# Patient Record
Sex: Female | Born: 1995 | Hispanic: Yes | Marital: Single | Smoking: Never smoker
Health system: Southern US, Community
[De-identification: ages and names within clinical notes are randomized; demographics above are authoritative.]

## PROBLEM LIST (undated history)

## (undated) DIAGNOSIS — Z87798 Personal history of other (corrected) congenital malformations: Secondary | ICD-10-CM

## (undated) DIAGNOSIS — Z789 Other specified health status: Secondary | ICD-10-CM

## (undated) HISTORY — DX: Personal history of other (corrected) congenital malformations: Z87.798

---

## 2016-10-28 NOTE — L&D Delivery Note (Signed)
Patient is 21 y.o. G2P1001 101w6d admitted SROM. S/p IOL with foley bulb, followed by Pitocin.  Prenatal course also complicated by hx pCS in [place].  Delivery Note At 6:19 AM a viable female was delivered via VBAC, Spontaneous (Presentation: LOA, compound).  APGAR: 9, 9; weight  pending.   Placenta status: intact with 3 vessels.  Cord: none.  Cord pH: N/A  Anesthesia:  Epidural Episiotomy: None Lacerations: 2nd degree;Perineal Suture Repair: 2.0 vicryl rapide Est. Blood Loss (mL): 200  Mom to postpartum.  Baby to Couplet care / Skin to Skin.  Upon arrival patient was complete and pushing. She pushed with good maternal effort to deliver a viable female infant in cephalic, LOA position, compound presentation. No nuchal cord present. Baby delivered without difficulty (anterior shoulder delivered with ease), was noted to have good tone and place on maternal abdomen for oral suctioning, drying and stimulation. Delayed cord clamping performed. Placenta delivered spontaneously with gentle cord traction. Fundus firm with massage and Pitocin. Perineum inspected and found to have 2nd degree perineal laceration, which was repaired with 2-0 Vicryl Rapide with good hemostasis achieved. Counts of sharps, instruments, and lap pads were all correct.   Bedelia Person, CNM, supervised delivery and repair.  Burna Cash, MD Family Medicine Resident, PGY-3 07/15/2017 6:51 AM  Midwife attestation: I was gloved and present for delivery in its entirety and I agree with the above resident's note.  Donette Larry, CNM 7:09 AM

## 2017-01-13 LAB — OB RESULTS CONSOLE GC/CHLAMYDIA
CHLAMYDIA, DNA PROBE: NEGATIVE
GC PROBE AMP, GENITAL: NEGATIVE

## 2017-01-13 LAB — OB RESULTS CONSOLE ABO/RH: RH TYPE: POSITIVE

## 2017-01-13 LAB — OB RESULTS CONSOLE RUBELLA ANTIBODY, IGM: Rubella: IMMUNE

## 2017-01-13 LAB — OB RESULTS CONSOLE RPR: RPR: NONREACTIVE

## 2017-01-13 LAB — OB RESULTS CONSOLE ANTIBODY SCREEN: ANTIBODY SCREEN: NEGATIVE

## 2017-01-13 LAB — OB RESULTS CONSOLE HIV ANTIBODY (ROUTINE TESTING): HIV: NONREACTIVE

## 2017-01-13 LAB — OB RESULTS CONSOLE HEPATITIS B SURFACE ANTIGEN: HEP B S AG: NEGATIVE

## 2017-02-04 ENCOUNTER — Other Ambulatory Visit (HOSPITAL_COMMUNITY): Payer: Self-pay | Admitting: Nurse Practitioner

## 2017-02-04 DIAGNOSIS — Z368A Encounter for antenatal screening for other genetic defects: Secondary | ICD-10-CM

## 2017-02-21 ENCOUNTER — Ambulatory Visit (HOSPITAL_COMMUNITY)
Admission: RE | Admit: 2017-02-21 | Discharge: 2017-02-21 | Disposition: A | Discharge status: Home / Self Care | Payer: Self-pay | Source: Ambulatory Visit | Attending: Nurse Practitioner | Admitting: Nurse Practitioner

## 2017-02-21 ENCOUNTER — Encounter (HOSPITAL_COMMUNITY): Payer: Self-pay

## 2017-02-21 ENCOUNTER — Other Ambulatory Visit (HOSPITAL_COMMUNITY): Payer: Self-pay

## 2017-02-21 ENCOUNTER — Other Ambulatory Visit (HOSPITAL_COMMUNITY): Payer: Self-pay | Admitting: *Deleted

## 2017-02-21 DIAGNOSIS — O283 Abnormal ultrasonic finding on antenatal screening of mother: Secondary | ICD-10-CM

## 2017-02-21 DIAGNOSIS — Z8279 Family history of other congenital malformations, deformations and chromosomal abnormalities: Secondary | ICD-10-CM | POA: Insufficient documentation

## 2017-02-21 DIAGNOSIS — Z822 Family history of deafness and hearing loss: Secondary | ICD-10-CM

## 2017-02-21 DIAGNOSIS — Z363 Encounter for antenatal screening for malformations: Secondary | ICD-10-CM | POA: Insufficient documentation

## 2017-02-21 DIAGNOSIS — Z3A20 20 weeks gestation of pregnancy: Secondary | ICD-10-CM | POA: Insufficient documentation

## 2017-02-21 DIAGNOSIS — Z368A Encounter for antenatal screening for other genetic defects: Secondary | ICD-10-CM | POA: Insufficient documentation

## 2017-02-21 HISTORY — DX: Other specified health status: Z78.9

## 2017-02-25 ENCOUNTER — Encounter (HOSPITAL_COMMUNITY): Payer: Self-pay

## 2017-02-25 DIAGNOSIS — Z822 Family history of deafness and hearing loss: Secondary | ICD-10-CM | POA: Insufficient documentation

## 2017-02-25 NOTE — Progress Notes (Signed)
Genetic Counseling  High-Risk Gestation Note  Appointment Date:  02/21/2017 Referred By: Jamie Click, NP Date of Birth:  [birth year] Partner:  Jamie Hess   Pregnancy History: [id number] Estimated Date of Delivery: 07/09/17 Estimated Gestational Age: 28w2dAttending: EAbram Sander MD   I met with Jamie. Jamie Gollidayfor genetic counseling because of a family history of hearing loss/deafness. Telephonic Spanish/English medical interpreter (Jamie Hess) #[phone] interpretation for today's visit.   In summary:  Discussed family history of hearing loss/deafness for the couple's 513year Jamie daughter, unknown etiology  Reviewed prelingual hearing loss can be acquired/environmental (~20%) versus genetic (~80%)  Prelingual hearing loss can be syndromic versus nonsyndromic; [id number] prelingual hearing loss is genetic, nonsyndromic, and autosomal recessive  Reviewed various inheritance patterns seen for genetic causes of hearing loss including autosomal recessive, autosomal dominant, X-linked, mitochondrial  Recurrence risk for current pregnancy depends upon daughter's etiology (which is unknown) but would likely range from low to 25%  Discussed options of screening / testing  Expanded carrier screening panel (including some but not all genes associated with hearing loss)- patient accepted today  Discussed screening/testing for vast majority of genetic etiologies for hearing loss not available in the current pregnancy given that etiology is not determined for her daughter  Newborn screening hearing assessment  Discussed general population carrier screening options - accepted today, as part of expanded pan-ethnic carrier screening panel ([facility])  CF  SMA  Hemoglobinopathies  We began by reviewing the family history in detail. Jamie Hess that her daughter, AMarlene Hess has profound bilateral hearing loss of unknown  etiology. [place] that she did not have complications during her pregnancy with her daughter including no known exposure to infections. Additionally, the patient reported that her daughter did not have infections in the neonatal period, to the best of her knowledge. Jamie Hess has been evaluated at [facility] and the patient reported that she was possibly a candidate for cochlear implant. However, Jamie. ELaron Hess that they have not been able to pursue additional medical treatment or additional workup, given that Jamie Hess does not currently had medical insurance coverage. Jamie Hess that to the best of her knowledge, her daughter has not had genetic testing to assess for possible underlying genetic etiologies, which is likely in part due to the potential associated costs of additional testing. She reportedly walked at age 23 monthsbut was not described to additional developmental delays. She is currently 89years Jamie and is learning sign language through the school system. She was not described to have dysmorphic features and no additional medical concerns. The patient reported that Jamie. Jamie Hess current pregnancy is with the same partner. The patient reported that her father possibly had an uncle with hearing loss, but she had limited information regarding this history. There is no known consanguinity for Jamie Hess the father of the pregnancy.   We reviewed the hearing loss can be categorized as either prelingual (occurring prior to the development of speech) or postlingual (occurring after the development of normal speech). Prelingual hearing loss includes congenital hearing loss but does not necessarily have to be congenital. Additionally, hearing loss is typically categorized as either syndromic or nonsyndromic. The incidence of prelingual hearing loss is estimated to be 1 in 500. Prelingual hearing loss can  be hereditary or nonhereditary. It is estimated that for prelingual hearing loss in developed countries,  approximately 20% is acquired/environmental and approximately 80% is genetic.  Nonhereditary causes of prelingual hearing loss include teratogenic exposures in pregnancy, such as congenital infection or medications, or postnatal exposures, such as infections or medication use. Regarding genetic causes of prelingual hearing loss, approximately 20-30% is syndromic (including over 400 genetic syndromes), and approximately 70-80% is nonsyndromic. There are numerous forms of both syndromic and nonsyndromic hereditary hearing loss following various patterns of inheritance including autosomal recessive, autosomal dominant, X-linked, and mitochondrial inheritance. For nonsyndromic genetic hearing loss, the majority of cases follow autosomal recessive inheritance. The reported family history for Jamie Hess was not suggestive of syndromic forms of hearing loss. We thus, focused our conversation of discussing various explanations for nonsyndromic hearing loss.   We reviewed genes, chromosomes, and various patterns of inheritance including autosomal recessive, autosomal dominant, and X-linked inheritance. The majority of hereditary prelingual hearing loss is nonsyndromic and follows autosomal recessive inheritance. Approximately 50% of hereditary nonsyndromic hearing loss is attributed to DFNB1, which is caused by mutations in GJB2 (which encode protein connexin 26) and GJB6 (which encodes protein connexin 30). We discussed that in autosomal recessive inheritance, an individual typically has the particular disorder when both copies of a particular gene pair have a nonworking change. This is typically only inherited when both parents are at least carriers.  A carrier refers to an individual with one nonworking copy of the gene and one working copy of the gene pair. Each pregnancy of a carrier couple for a  particular autosomal recessive condition has a 1 in 4 (25%) chance to inherit the condition. All offspring of an individual with an autosomal recessive condition would be obligate carriers. The carrier frequency for the most common cause of autosomal recessive nonsyndromic hearing loss, GJB2, is approximately 1 in 61. Autosomal dominant inheritance describes one nonworking copy of a particular gene pair leading to a particular disorder, and recurrence risk for each offspring for an affected individual would be 1 in 2 (50%). X-linked inheritance typically describes females carrying a nonworking gene change on the X chromosome, typically mildly symptomatic to asymptomatic. Carrier females would have a 1 in 4 chance for each of the following with each pregnancy: a female who inherits the nonworking gene and is a carrier, a female who is neither a carrier nor affected, a female who is unaffected, and a female who inherits the nonworking gene and is affected. Males with an X-linked condition would be expected to have either daughters who are carriers or sons who are unaffected. Less commonly, mitochondrial genetic changes, which would be maternally inherited, can cause nonsyndromic hearing loss.  Jamie Hess understands that without knowing the specific etiology for hearing loss for her daughter, exact recurrence risk for hearing loss in the current pregnancy cannot be determined and subsequently prenatal screening or diagnosis for hearing loss in the current pregnancy is not likely to be available or informative. However, we discussed a range of recurrence risk estimates based on the available information.  In the case of an environmental cause, such as either in utero or postnatal exposure or infection, recurrence risk for hearing loss for their offspring would not be expected to be increased above the general population risk. In the case of autosomal recessive inheritance, the pregnancy would have a 1 in 4  (25%) chance for hearing loss/deafness. In the case of autosomal dominant, recurrence risk would likely be low, given that neither parent is reportedly symptomatic. An X-linked pattern of inheritance, is less likely, given  that the affected individual is female, but cannot be ruled out given that female carriers can be symptomatic in some X-linked conditions. In summary, recurrence risk may range from none (in the case of environmental causes for both the patient and her partner) to 25%.    We discussed that molecular testing is clinically available for genetic forms of hearing loss. A medical genetics evaluation with genetic testing, if warranted based on evaluation for the patient's daughter would be most informative to assess for underlying etiologies and better determine recurrence risk for relatives. The patient's daughter's pediatrician can facilitate a medical genetics evaluation, if desired for the patient's daughter.  In the case that an underlying genetic cause has been identified, prenatal diagnosis may be available via amniocentesis. We discussed the risks, benefits, and limitations of amniocentesis. However, we also discussed that the turnaround time for molecular testing can range, depending upon the number of genes that are tested and the methodology used. We discussed that prenatal diagnosis would not likely be an option in the current pregnancy, given her current gestational age and given that molecular testing would first need to be performed for daughter. The patient indicated that she would not likely be interested in molecular testing for her daughter at this time, given potential cost of testing.   We discussed the option of carrier screening for Jamie Hess via expanded pan-ethnic carrier screening. We reviewed that ACOG currently recommends that all patients be offered carrier screening for cystic fibrosis, spinal muscular atrophy and hemoglobinopathies, which are conditions  unrelated to the patient's reported family history. In addition, she was counseled that there are a variety of genetic screening laboratories that have pan-ethnic, or expanded, carrier screening panels, which evaluate carrier status for a wide range of genetic conditions. Some of the genes included on these panels relate to genetic forms of hearing loss. However, we discussed that this panel does not assess for all genetic forms of hearing loss and also includes carrier screening for conditions that are unrelated to hearing loss. Thus, this carrier screen would not necessarily and not likely diagnose the underlying cause for hearing loss in her daughter and also may identify carrier status for a separate condition(s). Some of these conditions included on the panel are severe and actionable, but also rare; others occur more commonly, but are less severe. We reviewed that the prevalence of each condition varies (and often varies with ethnicity). Thus the couples' background risk to be a carrier for each of these various conditions would range, and in some cases be very low or unknown. Similarly, the detection rate varies with each condition and also varies in some cases with ethnicity, ranging from greater than 99% (in the case of hemoglobinopathies) to unknown. We reviewed that a negative carrier screen would thus reduce, but not eliminate the chance to be a carrier for these conditions. For some conditions included on specific pan-ethnic carrier screening panels, the pre-test carrier frequency and/or the detection rate is unknown. After careful consideration, Jamie. Lavoris Canizales elected to proceed with expanded pan-ethnic carrier screening panel through [facility] laboratory, which includes 175 autosomal recessive and X-linked conditions.    We reviewed that hearing is assessed as part of newborn screening for babies born in the hospital in [place] and that it would be important for her child's pediatrician to  be aware of this history.    The family histories were otherwise found to be contributory for autism for a niece and nephew of the father of  the pregnancy (each affected relative is a child of a different sister to the father of the pregnancy). Limited information was known regarding whether or not an underlying etiology was determined. The children of the additional 7 siblings to the father of the pregnancy are reportedly healthy. We discussed that autism is part of the spectrum of conditions referred to as Autistic spectrum disorders (ASD). We discussed that ASDs are among the most common neurodevelopmental disorders, with approximately 1 in 68 children meeting criteria for ASD, according to the Centers for Disease Control. Approximately 80% of individuals diagnosed are female. There is strong evidence that genetic factors play a critical role in development of ASD. There have been recent advances in identifying specific genetic causes of ASD, however, there are still many individuals for whom the etiology of the ASD is not known. The majority of individuals with ASD (70-80%) have essential autism. There is strong evidence that genetic factors play a critical role in development of ASD. Some individuals with ASDs are found to have causative differences in karyotype analysis, chromosomal microarray analysis, or single genes. These are more likely to be identified in individuals with complex autism spectrum disorders.  Once a family has a child with a diagnosis of ASD, there is a 13.5% chance to have another child with ASD. If the pregnancy is female the chance is approximately 9%, and approximately 26% if the pregnancy is female. Limited information is available regarding recurrence risk estimated for extended degree relatives. They understand that at this time there is not prenatal genetic screening or testing available for ASD for most families.  Without further information regarding the provided family history, an  accurate genetic risk cannot be calculated. Further genetic counseling is warranted if more information is obtained.   Jamie. Shimeka Bacot previously had [facility] screening, which was within normal range for the conditions screened. This screening does not diagnose or rule out these conditions but rather provides a pregnancy specific risk assessment. Detailed ultrasound was performed today. Complete ultrasound results reported under separate cover.   Jamie Calandria Mullings denied exposure to environmental toxins or chemical agents. She denied the use of alcohol, tobacco or street drugs. She denied significant viral illnesses during the course of her pregnancy. Her medical and surgical histories were noncontributory.   I counseled Jamie. Arnell Sieving regarding the above risks and available options.  The approximate face-to-face time with the genetic counselor was 50 minutes.  Chipper Oman, Jamie Certified Genetic Counselor 02/25/2017

## 2017-02-28 ENCOUNTER — Other Ambulatory Visit: Payer: Self-pay

## 2017-03-07 ENCOUNTER — Telehealth (HOSPITAL_COMMUNITY): Payer: Self-pay | Admitting: MS"

## 2017-03-07 NOTE — Telephone Encounter (Signed)
Called Ms. Moshe CiproMaria Espinoza Parra via [facility] telephonic interpreter, OdellPablo, #[phone] to discuss her carrier screening results. Mrs. Moshe CiproMaria Espinoza Parra had expanded carrier screening through [facility] (175 conditions). The patient was identified by name and DOB. We reviewed that the results are negative for all of the conditions for which analysis was performed, with the exception of PCDH15-related disorders and Nephrotic syndrome, NPHS2-related.  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 We discussed both conditions briefly for which she screened positive.   Most of the conversation focused on the PCDH15-related disorders result, given the patient's family history of a previous daughter with congenital deafness. PCDH15-related disorders are associated with hearing loss with/without vision loss. Specifically, Usher syndrome type 32F is caused by pathogenic variants in [facility] L[facility] Memorial HospitalCDH15, which is associated with profound bilateral congenital deafness, balanced problems, and retinitis pigmentosa. We discussed that while this does not directly diagnose the underlying cause of the congenital hearing loss for the couple's previous child, it increases the suspicion for this condition as a possible underlying differential diagnosis. The patient understands that medical evaluation for her daughter and genetic testing for her would be needed to establish the underlying cause of deafness for her daughter.   We reviewed that both conditions for which Ms. Loretha Staplerspinoza Parra screened positive follow autosomal recessive inheritance, and thus the risk to the current pregnancy is partly determined by carrier status of the father of the pregnancy. When both parents are carriers for the same autosomal recessive condition, there is a 1 in 4 (25%) chance for each pregnancy to  be affected.   We discussed the option of carrier screening for her partner, Burley Saveredro for both PCDH15-related disorders (including Usher syndrome 32F) and for Nephrotic syndrome, NPHS2-related, given that this would more accurately determine risk assessment for each condition in the current pregnancy. The patient inquired about cost of testing, given that he currently does not have medical insurance. We discussed that [facility] has financial assistance and given the eligibility criteria listed on the lab website, he would likely qualify for no charge testing.  The patient was offered and accepted follow-up genetic counseling, which was scheduled at the time of her return ultrasound on 5/25 to discuss her carrier screening results in more detail. She stated that she will plan for her partner to accompany her to that appointment and for him to pursue carrier screening at that time.   Carrier screening does not detect all carriers of all of these conditions, but a normal result significantly decreases the likelihood of being a carrier, and therefore, the overall reproductive risk. [facility] sequences most of the genes, which is associated with a high detection rate for carriers, thus a negative screen is very reassuring. All questions were answered to her satisfaction, she was encouraged to call with additional questions or concerns. ? Quinn PlowmanKaren Maryalyce Sanjuan, MS Patent attorneyCertified Genetic Counselor

## 2017-03-21 ENCOUNTER — Ambulatory Visit (HOSPITAL_COMMUNITY)
Admission: RE | Admit: 2017-03-21 | Discharge: 2017-03-21 | Disposition: A | Discharge status: Home / Self Care | Payer: Self-pay | Source: Ambulatory Visit | Attending: Nurse Practitioner | Admitting: Nurse Practitioner

## 2017-03-21 ENCOUNTER — Encounter (HOSPITAL_COMMUNITY): Payer: Self-pay

## 2017-03-21 ENCOUNTER — Other Ambulatory Visit (HOSPITAL_COMMUNITY): Payer: Self-pay | Admitting: Maternal & Fetal Medicine

## 2017-03-21 DIAGNOSIS — Z362 Encounter for other antenatal screening follow-up: Secondary | ICD-10-CM

## 2017-03-21 DIAGNOSIS — Z3A24 24 weeks gestation of pregnancy: Secondary | ICD-10-CM

## 2017-03-21 DIAGNOSIS — Z8279 Family history of other congenital malformations, deformations and chromosomal abnormalities: Secondary | ICD-10-CM

## 2017-03-21 DIAGNOSIS — Z148 Genetic carrier of other disease: Secondary | ICD-10-CM

## 2017-03-21 DIAGNOSIS — Z822 Family history of deafness and hearing loss: Secondary | ICD-10-CM

## 2017-03-21 DIAGNOSIS — O283 Abnormal ultrasonic finding on antenatal screening of mother: Secondary | ICD-10-CM

## 2017-03-21 NOTE — Progress Notes (Signed)
Genetic Counseling  High-Risk Gestation Note  Appointment Date:  03/21/2017 Referred By: Jolaine Click, NP Date of Birth:  01-Oct-1996 Partner:  Arbie Cookey   Pregnancy History: [id number] Estimated Date of Delivery: 07/09/17 Estimated Gestational Age: 18w2dAttending: MGriffin Dakin MD   I met with Ms. 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 [facility]English medical interpreter, EDanae Chen provided interpretation for today's visit.   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  Reviewed risks to offspring  Prior to testing partner  After negative test  After positive test  Discussed options of screening / testing  Carrier screening for partner for NPHS2 and PCDH15 only  Expanded carrier screening for partner  Patient's partner, PMeda Coffee plans to return to our office on 5/29 for lab draw, likely for expanded carrier screen through [facility]  Spent time particularly discussing Usher syndrome type 55F (PCDH15-related disorders) given the described medical features for couple's previous daughter  It may be helpful for couple's daughter to be referred by her PCP for medical genetics evaluation, regardless of Mr. MEdsel Petrin carrier screen results to ensure that she is being screened appropriately for potential medical issues   In the event that partner is also identified to have carrier status for either condition, patient declines prenatal diagnosis via amniocentesis  We began by reviewing the process of expanded carrier screening and autosomal recessive inheritance. We discussed that each person is estimated to have 7-10 genes that do not work correctly, meaning that each person is  estimated to be a carrier of 7-10 different genetic conditions. The majority of these conditions follow autosomal recessive inheritance. We reviewed that we each have two copies of all of our genes, one inherited from each parent. In a recessive condition, if one copy of the pair of genes is changed in a way that causes it not to function properly, but the other copy of the gene works, a person is considered to be a "carrier" for that condition. As carriers have a functioning gene, they typically do not have any health problems related to the non-working gene(s). If both parents are carriers for the same recessive condition, there is a 1 in 4 chance with each pregnancy to have a child who receives both non-working genes and is affected with that specific condition.   Ms MTorie TowleParra's carrier screen identified that she is a carrier of PCDH15-related disorders and Nephrotic syndrome, NPHS2-related. We discussed that each of these conditions follows an autosomal recessive pattern of inheritance and briefly reviewed genes and chromosomes. Thus, being a carrier would not be sufficient for symptom development and is not expected to impact the patient's personal medical management. However, if her reproductive partner were to also be a carrier for one of those conditions, there would be a 1 in 4 (25%) chance, with each pregnancy, to have an affected offspring. We reviewed each of these conditions.  We spent the majority of time today discussing PCDH15-related disorders, which are a group of disorders associated with hearing loss with or without vision loss. Pathogenic variants in this gene are most associated with Usher syndrome type 55F, though some mutations in [facility] been rarely reported in recessive nonsyndromic hearing loss and deafness, referred to as DFNB23. More severe variants in the gene, such as nonsense, splicing,  frameshift, and large deletions are typically associated with Usher syndrome  phenotype. The pathogenic variant Ms. Alaiza Yau was identified to carry in the [facility] gene is c.733C>T (R245X).  This specific variant has been reported in the literature associated with Usher syndrome type 1 and is also reported in higher frequency among individuals with Ashkenazi Jewish ancestry (Yosef et al. 2003; Rosalene Billings Z et al 2004). Usher syndrome type 1 is characterized by congenital profound bilateral sensorineural hearing loss, vestibular dysfunction, which typically leads to delays in motor milestones such as sitting alone and walking, and retinitis pigmentosa.  We discussed that retinitis pigmentosa is a condition characterized by night blindness and progressive, gradual loss of peripheral vision. Individuals with Usher syndrome type 1 typically have onset of RP in childhood to early adolescence. Overall treatment for Usher syndrome includes early opportunities to develop communication skills. Lifespan and intelligence are not expected to be impacted by PCDH15-related disorders.   We discussed that the carrier screening does not directly provide an underlying diagnosis for the couple's daughter's hearing loss, but increases the suspicion for Usher syndrome type 1, specifically PCDH15-related, in the differential diagnosis list. We reviewed the importance of her daughter being followed regularly by her primary care physician and having appropriate screenings. Additionally, their daughter's primary care provider may refer her for a medical genetics evaluation to assess for underlying etiology and discuss potential associated management.    Nephrotic syndrome, NPHS2-related is a form of hereditary kidney disease that causes issues with kidney function, often leading to kidney failure. Pathogenic changes in the NPHS2 gene specifically cause a form of nephrotic syndrome that is resistant to steroid treatment and is also referred to as Steroid Resistant Nephrotic syndrome. Clinical features include  childhood onset, often between 52 and 13 months of age, of proteinuria, hypoalbuminemia, hyperlipidemia, and edema. The disorder is progressive and typically results in kidney failure. Treatment goal is to minimize damage to the kidneys, and kidney transplant is required in cases of kidney failure. The incidence of the condition reportedly ranges from 2 to 16 per 100,000. Thus, prior to carrier screening for Mr. Edsel Petrin, his risk to be a carrier for NPHS2 pathogenic changes is approximately 1 in 400. The risk for NPHS2-related nephrotic syndrome in a pregnancy together for the couple, prior to carrier screening for Mr. Edsel Petrin is approximately up to 1 in 1,600.   We discussed the option of carrier screening for her partner Arbie Cookey. There are different options available for carrier screening through [facility]. We discussed that he could be tested for only those conditions for which Ms. Evalyse Stroope is a carrier, to establish a reproductive risk for their offspring together. Alternatively, he could have carrier testing for the entire panel. As we already know she screened negative for those other conditions on the panel, if he were to find out he were a carrier of a different condition the chance for that condition in an offspring would remain small. However, each offspring would have a 50% chance to be a carrier for any condition that either parent carriers. Thus, it may be information that he would like to have in future, or for his extended family. We reviewed that the detection rate for PCDH15 pathogenic changes via gene sequencing through [facility] laboratory is approximately 93%, and the detection rate for NPHS2 pathogenic changes via gene sequencing through [facility] laboratory is >99%. Thus, a negative result for Mr. Edsel Petrin would reduce the risk for carrier status but would not completely eliminate the chance to  be a carrier nor completely eliminate the chance for the condition in offspring. We  also reviewed the possible availability of no charge testing via [facility] laboratory's [facility] program and provided the patient with information on how to apply with the laboratory. Ms. Gale Klar and her partner are interested in testing for him, likely for the expanded panel (approximately 175 conditions), but he was not with the patient at today's visit. He has an appointment for lab draw in our office on Tuesday, 5/29 at 8:30 am.   We reviewed that if both parents are identified to be carriers for the same genetic condition, prenatal diagnosis would be available via amniocentesis. We reviewed risks, benefits, and limitations of amniocentesis including the associated risk for pregnancy complications. Ms. Montserrath Madding reiterated that she would not be interested in amniocentesis in the pregnancy given the associated risk for complications but would rather pursue postnatal evaluation and genetic testing, if warranted.   I counseled Ms. Arnell Sieving regarding the above risks and available options. The approximate face-to-face time with the genetic counselor was 40 minutes.    Chipper Oman, MS Certified Genetic Counselor 03/21/2017

## 2017-03-31 ENCOUNTER — Other Ambulatory Visit: Payer: Self-pay

## 2017-04-10 ENCOUNTER — Telehealth (HOSPITAL_COMMUNITY): Payer: Self-pay | Admitting: MS"

## 2017-04-10 NOTE — Telephone Encounter (Signed)
Left message for patient through telephonic Spanish/English interpreter [phone] that I was calling with follow-up information for patient and her partner, Burley Saveredro, given that blood test results are back for both of them now. Left number for patient to return call.   Clydie BraunKaren Cordon Gassett 04/10/2017  9:52 AM

## 2017-04-11 ENCOUNTER — Telehealth (HOSPITAL_COMMUNITY): Payer: Self-pay | Admitting: MS"

## 2017-04-11 NOTE — Telephone Encounter (Signed)
Called Jamie Hess given that results of carrier screening are now available for her partner, Mr. Jamie Hess to review both of their carrier screening results. [facility] Interpreter telephonic Spanish/English interpreter Lauris Poag [phone]), provided interpretation for today's phone call. Similar to Ms. Jamie Hess, Mr. Ashok Pall elected to pursue universal carrier screening (175 conditions) through [facility] laboratory. He elected to pursue this testing, given that Ms. Jamie Hess was identified as a carrier for two conditions. Mr. 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 Jamie Hess.   The majority of our discussion focused on this, given that they are both carriers for the same condition. 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 Given that both the patient and her husband were identified to be carriers for PCDH15-related disorders, and given that each pregnancy together has a 1 in 4 (25%) chance to inherit both nonworking gene copies and be affected, we discussed that this is most likely the explanation for their daughter's congenital deafness. However, their daughter has not yet had genetic testing to confirm this to be the case. Ms. Jamie Hess recalled our discussion from her previous genetic counseling visit that [facility] can cause Usher syndrome, specifically type 32F, and that features of Usher syndrome 32F can include hearing loss, balance problems, and progressive vision loss. The patient reported that her daughter does not currently have vision issues, but we reviewed the importance of screening given the increased risk for underlying Usher syndrome in their daughter. We discussed the importance of sharing this information with their daughter's primary care physician, and that that a referral to medical genetics and ophthalmology may be warranted for their daughter.    We briefly reviewed the autosomal recessive inheritance of PCDH15-related disorders, and the 1 in 4 (25%) risk for the current pregnancy to be affected (homozygous for the pathogenic variant identified in the patient and her partner). Each pregnancy together also has a 1 in 2 (50%) chance to be a carrier, and a 1 in 4 (25%) chance to be neither medically affected nor be a carrier.  Ms. Jamie Hess recalled that prenatal diagnosis via amniocentesis would be an option, but she also recalled the associated risk for complications with amniocentesis and stated that they decline amniocentesis, preferring to await postnatal evaluation for the current pregnancy. We reviewed that genetic testing for [facility] for the current pregnancy would not likely be performed in the initial newborn period unless medical providers felt this was warranted after appropriate physical evaluation for the baby.   We also discussed that Mr. Alger Memos carrier screening was negative for the other condition Ms. Jamie Hess was identified to carry, Nephrotic Syndrome (NPHS2-related). Carrier screening by gene sequencing of NPHS2 has >99% detection rate for carriers. Thus, their risk for this condition to be in offspring together has been reduced to less than 1 in 100,000.   Mr. 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 The specific pathogenic variant he was identified to carry in CYP21A2 gene is denoted as c.844G>T (V282L), which is associated with non-classic 21-OHD CAH, meaning it is associated with milder features. We reviewed that [name]'s screening for 21-OHD CAH was negative, and detection rate is approximately 95% for 21-OHD CAH carriers. Thus, their risk for 21-OHD CAH in offspring together is approximately 1 in 4,300. We reviewed that carrier status for each of these  conditions is not expected to be associated with  medical symptoms.   All questions were answered to the patient's satisfaction at this time. She was encouraged to call back with additional questions or concerns.   [name]  04/11/2017 3:19 PM

## 2017-04-11 NOTE — Telephone Encounter (Signed)
Attempted to contact patient regarding carrier screening results for her and her husband, Carlos Americanedro Martinez (MRN: [id number]). Left message through Spanish/English medical telephonic interpreter [phone] for patient to return call.   Clydie BraunKaren Koray Soter 04/11/2017 10:24 AM

## 2017-04-15 ENCOUNTER — Encounter: Payer: Self-pay | Admitting: Obstetrics and Gynecology

## 2017-04-15 DIAGNOSIS — Z789 Other specified health status: Secondary | ICD-10-CM | POA: Insufficient documentation

## 2017-04-15 DIAGNOSIS — Z98891 History of uterine scar from previous surgery: Secondary | ICD-10-CM | POA: Insufficient documentation

## 2017-04-15 NOTE — Progress Notes (Signed)
MD Consult  04/15/2017  [facility]  CC: delivery planning 21 y/o G2P1001 @ 27/6 with the above CC. 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 Patient states she had 08/2011 [place] at around 3 weeks before her due date; that child weighed approx. 3kg. It's hard to tell exactly what happened but she was never pushing and it sounded like she had bleeding and pain and abnormal FHR and was taken back for a C-section; she states they never told her that she needed another c-section.  no PTL or decreased FM today  PMHx: as per HPI PSurgHx: C-section x 1 Meds: PNV NKDA  NAD Gravid, nttp, soft, well healed vertical midline skin incision Labs: no new labs Radiology: 5/25 MFM u/s reviewed. normal except for small HC. no follow up needed  a/p: pt doing well d/w patient re: delivery planning. I told her that based on what she's telling me that she can try for a natural delivery. risks of d/w her re: TOLAC (consent reviewed), namely 1% risk of uterine rupture and risk of her never being in labor so unknown how her labor will progress. also d/w her that would recommended delivery at 41wks and that that would be an IOL if she wanted to still TOLAC. also d/w her that she can always change her mind that today's consent is just to properly inform of the [facility].   [facility]. consent signed today  Interpreter used.   Cornelia Copaharlie Lidiya Reise, Jr MD Attending Center for [facility] 04/15/2017 Time: [phone]

## 2017-05-09 NOTE — Addendum Note (Signed)
Encounter addended by: Augustin Coupeorneliussen, Marsi Turvey Louise Ech on: 05/09/2017  1:24 PM<BR>    Actions taken: Letter status changed

## 2017-05-26 ENCOUNTER — Telehealth (HOSPITAL_COMMUNITY): Payer: Self-pay | Admitting: Lactation Services

## 2017-05-26 NOTE — Telephone Encounter (Signed)
Message left on LC voice mail that mom having difficulty with oversupply. Called # left [phone], dad asked for interpreter. Then called dad with assistance of [facility] phone interpreter Donald PoreJorge [phone]. Dad reports he is at work and will not be home until after 6. He then gave us mom's phone # [phone] and her name "Jamie Hess". Attempted to call mom via interpreter, went straight to voicemail. Will retry later today.

## 2017-05-26 NOTE — Telephone Encounter (Signed)
Using [facility] Spanish interpreter JaconaJanett, [phone], attempted to call again to discuss oversupply of breast milk. Was not given permission to leave message.

## 2017-06-09 LAB — OB RESULTS CONSOLE GC/CHLAMYDIA
Chlamydia: NEGATIVE
Gonorrhea: NEGATIVE

## 2017-06-09 LAB — OB RESULTS CONSOLE GBS: STREP GROUP B AG: NEGATIVE

## 2017-07-10 ENCOUNTER — Telehealth (HOSPITAL_COMMUNITY): Payer: Self-pay | Admitting: *Deleted

## 2017-07-10 ENCOUNTER — Other Ambulatory Visit (HOSPITAL_COMMUNITY): Payer: Self-pay | Admitting: Nurse Practitioner

## 2017-07-10 ENCOUNTER — Encounter (HOSPITAL_COMMUNITY): Payer: Self-pay | Admitting: *Deleted

## 2017-07-10 DIAGNOSIS — O480 Post-term pregnancy: Secondary | ICD-10-CM

## 2017-07-10 NOTE — Telephone Encounter (Signed)
[phone] interpreter number  Preadmission screen

## 2017-07-10 NOTE — Telephone Encounter (Signed)
[phone] interpreter number

## 2017-07-12 ENCOUNTER — Inpatient Hospital Stay (HOSPITAL_COMMUNITY)
Admission: AD | Admit: 2017-07-12 | Discharge: 2017-07-12 | Disposition: A | Discharge status: Home / Self Care | Payer: Self-pay | Source: Ambulatory Visit | Attending: Obstetrics and Gynecology | Admitting: Obstetrics and Gynecology

## 2017-07-12 DIAGNOSIS — O479 False labor, unspecified: Secondary | ICD-10-CM

## 2017-07-12 DIAGNOSIS — Z822 Family history of deafness and hearing loss: Secondary | ICD-10-CM

## 2017-07-12 DIAGNOSIS — Z3A40 40 weeks gestation of pregnancy: Secondary | ICD-10-CM | POA: Insufficient documentation

## 2017-07-12 DIAGNOSIS — O471 False labor at or after 37 completed weeks of gestation: Secondary | ICD-10-CM | POA: Insufficient documentation

## 2017-07-12 LAB — POCT FERN TEST: POCT Fern Test: NEGATIVE

## 2017-07-12 NOTE — MAU Note (Signed)
Contractions started at 10 last night.  No water leaking, small amt of blood.

## 2017-07-12 NOTE — MAU Note (Signed)
Pt. May ambulate hallway for one hour.  Pt. Given instructions, if SROM, return to unit immediately; interpreter at [facility] during triage, assessment and ambulation instructions.

## 2017-07-12 NOTE — MAU Note (Signed)
..   I have communicated with Dr. Frances Furbish and reviewed vital signs:  Vitals:   07/12/17 0856 07/12/17 1112  BP: 124/70 110/65  Pulse: 90 89  Resp: 18   Temp: 98.3 F (36.8 C)   SpO2: 100%     Vaginal exam:  Dilation: 1 Effacement (%): Thick Cervical Position: Posterior Exam by:: Alanya Vukelich, RN ,   Also reviewed contraction pattern and that non-stress test is reactive.  It has been documented that patient is contracting every 2-5 minutes with no cervical change over one hours not indicating active labor.  Patient denies any other complaints.  Based on this report provider has given order for discharge.  A discharge order and diagnosis entered by a provider.   Labor discharge instructions reviewed with patient.

## 2017-07-14 ENCOUNTER — Inpatient Hospital Stay (HOSPITAL_COMMUNITY): Payer: Medicaid Other | Admitting: Anesthesiology

## 2017-07-14 ENCOUNTER — Ambulatory Visit (HOSPITAL_COMMUNITY)
Admission: RE | Admit: 2017-07-14 | Discharge: 2017-07-14 | Disposition: A | Discharge status: Home / Self Care | Payer: Medicaid Other | Source: Ambulatory Visit | Attending: Nurse Practitioner | Admitting: Nurse Practitioner

## 2017-07-14 ENCOUNTER — Other Ambulatory Visit (HOSPITAL_COMMUNITY): Payer: Self-pay | Admitting: Nurse Practitioner

## 2017-07-14 ENCOUNTER — Inpatient Hospital Stay (HOSPITAL_COMMUNITY)
Admission: AD | Admit: 2017-07-14 | Discharge: 2017-07-17 | DRG: 775 | Disposition: A | Discharge status: Home / Self Care | Payer: Medicaid Other | Source: Ambulatory Visit | Attending: Family Medicine | Admitting: Family Medicine

## 2017-07-14 ENCOUNTER — Encounter (HOSPITAL_COMMUNITY): Payer: Self-pay

## 2017-07-14 DIAGNOSIS — Z3A40 40 weeks gestation of pregnancy: Secondary | ICD-10-CM

## 2017-07-14 DIAGNOSIS — O4202 Full-term premature rupture of membranes, onset of labor within 24 hours of rupture: Secondary | ICD-10-CM | POA: Diagnosis present

## 2017-07-14 DIAGNOSIS — O326XX0 Maternal care for compound presentation, not applicable or unspecified: Secondary | ICD-10-CM | POA: Diagnosis present

## 2017-07-14 DIAGNOSIS — Z6831 Body mass index (BMI) 31.0-31.9, adult: Secondary | ICD-10-CM

## 2017-07-14 DIAGNOSIS — Z789 Other specified health status: Secondary | ICD-10-CM | POA: Diagnosis present

## 2017-07-14 DIAGNOSIS — O4290 Premature rupture of membranes, unspecified as to length of time between rupture and onset of labor, unspecified weeks of gestation: Secondary | ICD-10-CM | POA: Diagnosis present

## 2017-07-14 DIAGNOSIS — O480 Post-term pregnancy: Secondary | ICD-10-CM | POA: Insufficient documentation

## 2017-07-14 DIAGNOSIS — O34211 Maternal care for low transverse scar from previous cesarean delivery: Principal | ICD-10-CM | POA: Diagnosis present

## 2017-07-14 DIAGNOSIS — O99214 Obesity complicating childbirth: Secondary | ICD-10-CM | POA: Diagnosis present

## 2017-07-14 DIAGNOSIS — O34219 Maternal care for unspecified type scar from previous cesarean delivery: Secondary | ICD-10-CM | POA: Diagnosis not present

## 2017-07-14 DIAGNOSIS — E669 Obesity, unspecified: Secondary | ICD-10-CM | POA: Diagnosis present

## 2017-07-14 DIAGNOSIS — O26893 Other specified pregnancy related conditions, third trimester: Secondary | ICD-10-CM | POA: Diagnosis present

## 2017-07-14 DIAGNOSIS — Z822 Family history of deafness and hearing loss: Secondary | ICD-10-CM

## 2017-07-14 DIAGNOSIS — Z148 Genetic carrier of other disease: Secondary | ICD-10-CM

## 2017-07-14 DIAGNOSIS — Z758 Other problems related to medical facilities and other health care: Secondary | ICD-10-CM | POA: Diagnosis present

## 2017-07-14 DIAGNOSIS — Z98891 History of uterine scar from previous surgery: Secondary | ICD-10-CM

## 2017-07-14 LAB — TYPE AND SCREEN
ABO/RH(D): O POS
Antibody Screen: NEGATIVE

## 2017-07-14 LAB — CBC
HCT: 39.7 % (ref 36.0–46.0)
Hemoglobin: 13.6 g/dL (ref 12.0–15.0)
MCH: 30.3 pg (ref 26.0–34.0)
MCHC: 34.3 g/dL (ref 30.0–36.0)
MCV: 88.4 fL (ref 78.0–100.0)
Platelets: 163 10*3/uL (ref 150–400)
RBC: 4.49 MIL/uL (ref 3.87–5.11)
RDW: 14.9 % (ref 11.5–15.5)
WBC: 10.5 10*3/uL (ref 4.0–10.5)

## 2017-07-14 LAB — ABO/RH: ABO/RH(D): O POS

## 2017-07-14 LAB — POCT FERN TEST: POCT Fern Test: POSITIVE

## 2017-07-14 MED ORDER — DIPHENHYDRAMINE HCL 50 MG/ML IJ SOLN: 12.5000 mg | INTRAMUSCULAR | Status: DC | PRN

## 2017-07-14 MED ORDER — ACETAMINOPHEN 325 MG PO TABS
650.0000 mg | ORAL_TABLET | ORAL | Status: DC | PRN
  Administered 2017-07-15: 650 mg via ORAL
  Filled 2017-07-14 (×2): qty 2

## 2017-07-14 MED ORDER — OXYTOCIN 40 UNITS IN LACTATED RINGERS INFUSION - SIMPLE MED
1.0000 m[IU]/min | INTRAVENOUS | Status: DC
  Administered 2017-07-14: 2 m[IU]/min via INTRAVENOUS

## 2017-07-14 MED ORDER — EPHEDRINE 5 MG/ML INJ: 10.0000 mg | INTRAVENOUS | Status: DC | PRN

## 2017-07-14 MED ORDER — LIDOCAINE HCL (PF) 1 % IJ SOLN
INTRAMUSCULAR | Status: DC | PRN
  Administered 2017-07-14 (×2): 4 mL via EPIDURAL

## 2017-07-14 MED ORDER — OXYTOCIN 40 UNITS IN LACTATED RINGERS INFUSION - SIMPLE MED
2.5000 [IU]/h | INTRAVENOUS | Status: DC
Start: 2017-07-14 — End: 2017-07-15
  Filled 2017-07-14: qty 1000

## 2017-07-14 MED ORDER — LACTATED RINGERS IV SOLN: 500.0000 mL | INTRAVENOUS | Status: DC | PRN

## 2017-07-14 MED ORDER — OXYCODONE-ACETAMINOPHEN 5-325 MG PO TABS: 1.0000 | ORAL_TABLET | ORAL | Status: DC | PRN

## 2017-07-14 MED ORDER — OXYTOCIN BOLUS FROM INFUSION
500.0000 mL | Freq: Once | INTRAVENOUS | Status: AC
  Administered 2017-07-15: 500 mL via INTRAVENOUS

## 2017-07-14 MED ORDER — PHENYLEPHRINE 40 MCG/ML (10ML) SYRINGE FOR IV PUSH (FOR BLOOD PRESSURE SUPPORT)
80.0000 ug | PREFILLED_SYRINGE | INTRAVENOUS | Status: DC | PRN
  Filled 2017-07-14: qty 10

## 2017-07-14 MED ORDER — OXYCODONE-ACETAMINOPHEN 5-325 MG PO TABS: 2.0000 | ORAL_TABLET | ORAL | Status: DC | PRN

## 2017-07-14 MED ORDER — SOD CITRATE-CITRIC ACID 500-334 MG/5ML PO SOLN: 30.0000 mL | ORAL | Status: DC | PRN

## 2017-07-14 MED ORDER — TERBUTALINE SULFATE 1 MG/ML IJ SOLN: 0.2500 mg | Freq: Once | INTRAMUSCULAR | Status: DC | PRN

## 2017-07-14 MED ORDER — FENTANYL CITRATE (PF) 100 MCG/2ML IJ SOLN
100.0000 ug | INTRAMUSCULAR | Status: DC | PRN
  Administered 2017-07-14: 100 ug via INTRAVENOUS
  Filled 2017-07-14: qty 2

## 2017-07-14 MED ORDER — PHENYLEPHRINE 40 MCG/ML (10ML) SYRINGE FOR IV PUSH (FOR BLOOD PRESSURE SUPPORT): 80.0000 ug | PREFILLED_SYRINGE | INTRAVENOUS | Status: DC | PRN

## 2017-07-14 MED ORDER — ONDANSETRON HCL 4 MG/2ML IJ SOLN: 4.0000 mg | Freq: Four times a day (QID) | INTRAMUSCULAR | Status: DC | PRN

## 2017-07-14 MED ORDER — LACTATED RINGERS IV SOLN
INTRAVENOUS | Status: DC
  Administered 2017-07-14 – 2017-07-15 (×3): via INTRAVENOUS

## 2017-07-14 MED ORDER — LACTATED RINGERS IV SOLN
500.0000 mL | Freq: Once | INTRAVENOUS | Status: AC
  Administered 2017-07-14: 500 mL via INTRAVENOUS

## 2017-07-14 MED ORDER — LIDOCAINE HCL (PF) 1 % IJ SOLN
30.0000 mL | INTRAMUSCULAR | Status: DC | PRN
  Administered 2017-07-15: 30 mL via SUBCUTANEOUS
  Filled 2017-07-14: qty 30

## 2017-07-14 MED ORDER — FENTANYL 2.5 MCG/ML BUPIVACAINE 1/10 % EPIDURAL INFUSION (WH - ANES)
14.0000 mL/h | INTRAMUSCULAR | Status: DC | PRN
  Administered 2017-07-14: 12 mL/h via EPIDURAL
  Filled 2017-07-14: qty 100

## 2017-07-14 NOTE — Anesthesia Pain Management Evaluation Note (Signed)
  CRNA Pain Management Visit Note  Patient: Jamie Hess, 21 y.o., female  "Hello I am a member of the anesthesia team at [facility]. We have an anesthesia team available at all times to provide care throughout the hospital, including epidural management and anesthesia for C-section. I don't know your plan for the delivery whether it a natural birth, water birth, IV sedation, nitrous supplementation, doula or epidural, but we want to meet your pain goals."   1.Was your pain managed to your expectations on prior hospitalizations?   Yes   2.What is your expectation for pain management during this hospitalization?     Labor support without medications  3.How can we help you reach that goal? unsure  Record the patient's initial score and the patient's pain goal.   Pain: 4  Pain Goal: 8 [facility] wants you to be able to say your pain was always managed very well.  Cephus Shelling 07/14/2017

## 2017-07-14 NOTE — Progress Notes (Signed)
Spanish interpreter at bedside to discuss with patient education on IV pain medication, physical assessment, and plan of care.

## 2017-07-14 NOTE — Anesthesia Procedure Notes (Signed)
Epidural Patient location during procedure: OB Start time: 07/14/2017 10:07 PM  Staffing Anesthesiologist: Mal Amabile Performed: anesthesiologist   Preanesthetic Checklist Completed: patient identified, site marked, surgical consent, pre-op evaluation, timeout performed, IV checked, risks and benefits discussed and monitors and equipment checked  Epidural Patient position: sitting Prep: site prepped and draped and DuraPrep Patient monitoring: continuous pulse ox and blood pressure Approach: midline Location: L3-L4 Injection technique: LOR air  Needle:  Needle type: Tuohy  Needle gauge: 17 G Needle length: 9 cm and 9 Needle insertion depth: 6 cm Catheter type: closed end flexible Catheter size: 19 Gauge Catheter at skin depth: 11 cm Test dose: negative and Other  Assessment Events: blood not aspirated, injection not painful, no injection resistance, negative IV test and no paresthesia  Additional Notes Patient identified. Risks and benefits discussed including failed block, incomplete  Pain control, post dural puncture headache, nerve damage, paralysis, blood pressure Changes, nausea, vomiting, reactions to medications-both toxic and allergic and post Partum back pain. All questions were answered. Patient expressed understanding and wished to proceed. Sterile technique was used throughout procedure. Epidural site was Dressed with sterile barrier dressing. No paresthesias, signs of intravascular injection Or signs of intrathecal spread were encountered. Spanish interpreter used throughout procedure. Patient was more comfortable after the epidural was dosed. Please see RN's note for documentation of vital signs and FHR which are stable.

## 2017-07-14 NOTE — Anesthesia Preprocedure Evaluation (Signed)
Anesthesia Evaluation  Patient identified by MRN, date of birth, ID band Patient awake    Reviewed: Allergy & Precautions, Patient's Chart, lab work & pertinent test results  Airway Mallampati: III  TM Distance: >3 FB Neck ROM: Full    Dental no notable dental hx. (+) Teeth Intact   Pulmonary neg pulmonary ROS,    Pulmonary exam normal breath sounds clear to auscultation       Cardiovascular negative cardio ROS Normal cardiovascular exam Rhythm:Regular Rate:Normal     Neuro/Psych negative neurological ROS  negative psych ROS   GI/Hepatic Neg liver ROS, GERD  ,  Endo/Other  Obesity  Renal/GU negative Renal ROS  negative genitourinary   Musculoskeletal negative musculoskeletal ROS (+)   Abdominal (+) + obese,   Peds  Hematology negative hematology ROS (+)   Anesthesia Other Findings   Reproductive/Obstetrics (+) Pregnancy Previous C/Section Hx/o Carier for congenital disease (Steroid resistant Nephrotic syndrome)                             Anesthesia Physical Anesthesia Plan  ASA: II  Anesthesia Plan: Epidural   Post-op Pain Management:    Induction:   PONV Risk Score and Plan:   Airway Management Planned: Natural Airway  Additional Equipment:   Intra-op Plan:   Post-operative Plan:   Informed Consent: I have reviewed the patients History and Physical, chart, labs and discussed the procedure including the risks, benefits and alternatives for the proposed anesthesia with the patient or authorized representative who has indicated his/her understanding and acceptance.     Plan Discussed with: Anesthesiologist  Anesthesia Plan Comments:         Anesthesia Quick Evaluation

## 2017-07-14 NOTE — Progress Notes (Signed)
Comfortable after epidural. FHR 130, moderate, +accels, no decels, q2-3 min. Continue pitocin. Burna Cash, MD Family Medicine Resident, PGY-3 07/14/2017 11:46 PM

## 2017-07-14 NOTE — MAU Note (Addendum)
Pt was at health dept and her water broke around 1300. Having mild ctx. Was previous c-section but wants to VBAC. Was checked and told she was 3cm. Had ultrasound today that showed baby vertex.

## 2017-07-14 NOTE — H&P (Signed)
OBSTETRIC ADMISSION HISTORY AND PHYSICAL  Dejah Droessler is a 21 y.o. female G2P1001 with IUP at [redacted]w[redacted]d by LMP presenting for ROM. She reports her water broke around 1300.  She is not feeling contractions.  She reports +FMs, no VB, no blurry vision, headaches or peripheral edema, and no RUQ pain.  She plans on breast feeding. She would like condoms for birth control.  She received her prenatal care at [facility]   Dating: By LMP --->  Estimated Date of Delivery: 07/09/17  Sono:   , CWD, normal anatomy 07/14/17 BPP with cephalic presentation  Prenatal History/Complications: PNC at [facility] Hx of C/s with first pregnancy in [place]. 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 Pt and FOB carriers of Usher syndrome 25F  Past Medical History: Past Medical History:  Diagnosis Date  . 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 CESAREAN SECTION      Obstetrical History: OB History    Gravida Para Term Preterm AB Living   SAB TAB Ectopic Multiple Live Births                  Obstetric Comments   G1: 08/2011 c-section ? Abruption, 37wks, 3kg      Social History: Social History   Social History  . Marital status: Single    Spouse name: N/A  . Number of children: N/A  . Years of education: N/A   Social History Main Topics  . Smoking status: Never Smoker  . Smokeless tobacco: Never Used  . Alcohol use No  . Drug use: No  . Sexual activity: Yes    Birth control/ protection: None   Other Topics Concern  . None   Social History Narrative  . None    Family History: Family History  Problem Relation Age of Onset  . Hearing loss Daughter     Allergies: No Known Allergies  Prescriptions Prior to Admission  Medication Sig Dispense  Refill Last Dose  . Prenatal Vit-Fe Fumarate-FA (PRENATAL MULTIVITAMIN) TABS tablet Take 1 tablet by mouth daily at 12 noon.   07/11/2017 at Unknown time     Review of Systems  All systems reviewed and negative except as stated in HPI  Physical Exam Blood pressure 129/77, pulse 76, temperature 98.2 F (36.8 C), resp. rate 18, last menstrual period 10/02/2016. General appearance: alert and cooperative Lungs: clear to auscultation bilaterally Heart: regular rate and rhythm Abdomen: soft, non-tender; bowel sounds normal Pelvic: deferred Extremities: Homans sign is negative, no sign of DVT Presentation: cephalic Fetal monitoringBaseline: 140 bpm, Variability: Good {> 6 bpm), Accelerations: Reactive and Decelerations: Absent Uterine activity: q2-4 min    SVE: 1.5cm/thick  Prenatal labs: ABO, Rh: O/Positive/-- (03/19 0000) Antibody: Negative (03/19 0000) Rubella: Immune (03/19 0000) RPR: Nonreactive (03/19 0000)  HBsAg: Negative (03/19 0000)  HIV: Non-reactive (03/19 0000)  GBS: Negative (08/13 0000)  1 hr Glucola 145, 3 hr GTT WNL Genetic screening  Quad negative Anatomy US WNL  Prenatal Transfer Tool  Maternal Diabetes: No Genetic Screening: Normal Maternal Ultrasounds/Referrals: Normal Fetal Ultrasounds or other Referrals:  None Maternal Substance Abuse:  No Significant Maternal Medications:  None Significant Maternal Lab Results: Lab values include: Group B Strep negative  Results for orders placed or performed  during the hospital encounter of 07/14/17 (from the past 24 hour(s))  POCT fern test   Collection Time: 07/14/17  3:26 PM  Result Value Ref Range   POCT Fern Test Positive = ruptured amniotic membanes     Patient Active Problem List   Diagnosis Date Noted  . History of cesarean delivery 04/15/2017  . Language barrier 04/15/2017  . 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 [redacted] weeks gestation of pregnancy   . Family history of deafness and hearing loss  02/25/2017    Assessment/Plan:  Shondra Capps is a 21 y.o. G2P1001 at [redacted]w[redacted]d here for PROM at 1300 today. SVE 1.5cm/thick. Consent signed for TOLAC (h/o C/S x 1)  #Labor: start cervical ripening with FB and Pit #Pain: Epidural if desired #FWB: Category 1 #ID: GBS negative #MOF: breast #MOC:condoms #Circ:  N/a (girl)  Raynelle Fanning P. Bassam Dresch, MD OB Fellow

## 2017-07-14 NOTE — Progress Notes (Signed)
Labor Progress Note  Jamie Hess is a 21 y.o. G2P1001 at [redacted]w[redacted]d presented for ROM.  S: starting to feel ctx's more strongly and asking about pain control options. Partner supportive at bedside.  O:  BP 127/61   Pulse 77   Temp 98.7 F (37.1 C) (Oral)   Resp 18   Ht  (1.575 m)   Wt 77.6 kg (171 lb)   LMP 10/02/2016   BMI 31.28 kg/m    Abd notable for longitudinal skin incision.  2100: CVE: Dilation: 5 Effacement (%): 70 Cervical Position: Posterior Station: -2 Presentation: Vertex Exam by:: e. poore, rnc FB out Pit running  135, moderate, +accels, no decels, q72min - with coupling  A&P: 21 y.o. G2P1001 [redacted]w[redacted]d ROM. #Labor: TOLAC. Progressing well after FB, pit running. Will monitor very closely given lack of records from cesarean section done in [place]. #Hx pCS for unclear reasons: done in [place]. Records not available for review. #Pain: desires epidural #FWB: cat 1, overall reassuring #GBS negative   Seen with Donette Larry, CNM.   Burnard Leigh, MD 9:52 PM

## 2017-07-15 ENCOUNTER — Encounter (HOSPITAL_COMMUNITY): Payer: Self-pay | Admitting: *Deleted

## 2017-07-15 LAB — RPR: RPR: NONREACTIVE

## 2017-07-15 MED ORDER — WITCH HAZEL-GLYCERIN EX PADS: 1.0000 "application " | MEDICATED_PAD | CUTANEOUS | Status: DC | PRN

## 2017-07-15 MED ORDER — INFLUENZA VAC SPLIT QUAD 0.5 ML IM SUSY
0.5000 mL | PREFILLED_SYRINGE | INTRAMUSCULAR | Status: AC
  Administered 2017-07-17: 0.5 mL via INTRAMUSCULAR
  Filled 2017-07-15: qty 0.5

## 2017-07-15 MED ORDER — DIBUCAINE 1 % RE OINT: 1.0000 "application " | TOPICAL_OINTMENT | RECTAL | Status: DC | PRN

## 2017-07-15 MED ORDER — PRENATAL MULTIVITAMIN CH
1.0000 | ORAL_TABLET | Freq: Every day | ORAL | Status: DC
  Administered 2017-07-15 – 2017-07-16 (×2): 1 via ORAL
  Filled 2017-07-15 (×2): qty 1

## 2017-07-15 MED ORDER — ONDANSETRON HCL 4 MG/2ML IJ SOLN: 4.0000 mg | INTRAMUSCULAR | Status: DC | PRN

## 2017-07-15 MED ORDER — SENNOSIDES-DOCUSATE SODIUM 8.6-50 MG PO TABS
2.0000 | ORAL_TABLET | ORAL | Status: DC
  Administered 2017-07-16 (×2): 2 via ORAL
  Filled 2017-07-15 (×2): qty 2

## 2017-07-15 MED ORDER — TETANUS-DIPHTH-ACELL PERTUSSIS 5-2.5-18.5 LF-MCG/0.5 IM SUSP: 0.5000 mL | Freq: Once | INTRAMUSCULAR | Status: DC

## 2017-07-15 MED ORDER — ZOLPIDEM TARTRATE 5 MG PO TABS: 5.0000 mg | ORAL_TABLET | Freq: Every evening | ORAL | Status: DC | PRN

## 2017-07-15 MED ORDER — IBUPROFEN 600 MG PO TABS
600.0000 mg | ORAL_TABLET | Freq: Four times a day (QID) | ORAL | Status: DC
  Administered 2017-07-15 – 2017-07-17 (×8): 600 mg via ORAL
  Filled 2017-07-15 (×8): qty 1

## 2017-07-15 MED ORDER — DIPHENHYDRAMINE HCL 25 MG PO CAPS: 25.0000 mg | ORAL_CAPSULE | Freq: Four times a day (QID) | ORAL | Status: DC | PRN

## 2017-07-15 MED ORDER — BENZOCAINE-MENTHOL 20-0.5 % EX AERO
1.0000 "application " | INHALATION_SPRAY | CUTANEOUS | Status: DC | PRN
  Administered 2017-07-15 – 2017-07-17 (×2): 1 via TOPICAL
  Filled 2017-07-15 (×2): qty 56

## 2017-07-15 MED ORDER — COCONUT OIL OIL: 1.0000 "application " | TOPICAL_OIL | Status: DC | PRN

## 2017-07-15 MED ORDER — ACETAMINOPHEN 325 MG PO TABS
650.0000 mg | ORAL_TABLET | ORAL | Status: DC | PRN
  Administered 2017-07-15 – 2017-07-17 (×3): 650 mg via ORAL
  Filled 2017-07-15 (×2): qty 2

## 2017-07-15 MED ORDER — SIMETHICONE 80 MG PO CHEW: 80.0000 mg | CHEWABLE_TABLET | ORAL | Status: DC | PRN

## 2017-07-15 MED ORDER — ONDANSETRON HCL 4 MG PO TABS: 4.0000 mg | ORAL_TABLET | ORAL | Status: DC | PRN

## 2017-07-15 NOTE — Progress Notes (Signed)
Limited cervical change since last check - 5/80/-2 . FHR with variables for the past 20 min, good variability. Proceed with position changes and increasing pitocin, fetus tolerating. Burna Cash, MD Family Medicine Resident, PGY-3 07/15/2017 2:07 AM

## 2017-07-15 NOTE — Progress Notes (Signed)
UR chart review completed.  

## 2017-07-15 NOTE — Anesthesia Postprocedure Evaluation (Signed)
Anesthesia Post Note  Patient: Jamie Hess  Procedure(s) Performed: * No procedures listed *     Patient location during evaluation: Mother Baby Anesthesia Type: Epidural Level of consciousness: awake, awake and alert, oriented and patient cooperative Pain management: pain level controlled Vital Signs Assessment: post-procedure vital signs reviewed and stable Respiratory status: spontaneous breathing, nonlabored ventilation and respiratory function stable Cardiovascular status: stable Postop Assessment: no headache, no backache, epidural receding, patient able to bend at knees and no apparent nausea or vomiting Anesthetic complications: no Comments: [place] interpreter [place] utilized.    Last Vitals:  Vitals:   07/15/17 0913 07/15/17 1238  BP: 109/63 (!) 117/53  Pulse: 73 82  Resp: 18 16  Temp: 36.9 C 37.1 C  SpO2:      Last Pain:  Vitals:   07/15/17 1238  TempSrc: Oral  PainSc:    Pain Goal: Patients Stated Pain Goal: 7 (07/14/17 2000)               Chanah Tidmore L

## 2017-07-16 DIAGNOSIS — Z3A40 40 weeks gestation of pregnancy: Secondary | ICD-10-CM

## 2017-07-16 NOTE — Progress Notes (Signed)
With assistance of [place] video interpreter  (until video was lost and unable to reconnect) # [phone] and then [place] interpreter # [phone], explained to patient that her baby needs a serum bilirubin; explained about jaundice; also assessed patients pain and explained about ibuprofen and stool softeners. Pt asked if she had questions and verbalized no questions at this time after jaundice questions answered.

## 2017-07-16 NOTE — Progress Notes (Signed)
Charted in Error/ wrong time

## 2017-07-16 NOTE — Progress Notes (Signed)
CSW received consult due to score greater than 9, or positive for SI on Edinburgh Depression Screen.    CSW met with MOB in room 105 with hospital Spanish Interpreter.  When CSW arrived, MOB was resting on the couch and MOB's sister with holding infant.  MOB gave CSW permission to meet with MOB while MOB's sister was present. MOB was polite and receptive to meeting with CSW.  CSW explained CSW's role and encouraged MOB to ask questions.  CSW provided education regarding Baby Blues vs PMADs and provided MOB with information about support groups held at [place] encouraged MOB to evaluate her mental health throughout the postpartum period with the use of the New Mom Checklist developed by Postpartum Progress and notify a medical professional if symptoms arise.    CSW assessed for safety and MOB denied SI and HI.  MOB reports a wealth of supports and communicated that she is eager to parent.  There are no barriers to d/c. Laurey Arrow, MSW, LCSW Clinical Social Work [phone]

## 2017-07-16 NOTE — Progress Notes (Signed)
I was present during the Epidural procedure with Dr Malen Gauze, by Orlan Leavens Spanish Interpreter.

## 2017-07-16 NOTE — Progress Notes (Signed)
Post Partum Day 1  Subjective:  Ieshia Hatcher is a 21 y.o. [id number] [redacted]w[redacted]d s/p VBAC.  No acute events overnight.  Pt denies problems with ambulating, voiding or po intake.  She denies nausea or vomiting.  Pain is well controlled.  She has had flatus. She has not had bowel movement.  Lochia Moderate.  Plan for birth control is condoms.  Method of Feeding: breast feeding.  Objective: BP 120/65 (BP Location: Right Arm)   Pulse 90   Temp 97.7 F (36.5 C) (Oral)   Resp 18   Ht  (1.575 m)   Wt 77.6 kg (171 lb)   LMP 10/02/2016   SpO2 98%   Breastfeeding? Unknown   BMI 31.28 kg/m   Physical Exam:  General: alert, cooperative and no distress Lochia:normal flow Abdomen: +BS, soft, nontender, fundus firm  DVT Evaluation: No evidence of DVT seen on physical exam.   Recent Labs  07/14/17 1558  HGB 13.6  HCT 39.7   Assessment/Plan:  ASSESSMENT: Raye Wiens is a 21 y.o. G2P2002 [redacted]w[redacted]d ppd #1 s/pVBAC and is doing well.   Plan for discharge tomorrow and Breastfeeding   LOS: 2 days    A video interpreter was used during this interview.   Dory Larsen 07/16/2017, 7:48 AM

## 2017-07-16 NOTE — Lactation Note (Signed)
This note was copied from a baby's chart. Lactation Consultation Note  Patient Name: Girl Madisun Hargrove [id number] Date: 07/16/2017 Reason for consult: Initial assessment Baby 30 hr of life. Mom is offering formula because she does not think she has enough milk. Large drops of colostrum were easily expressed. Mom was excited to see milk. She was offering bottle of formula. Suggested if she feels the need to continue offering formula she use a cup or spoon. She was agreeable. Parents are aware of formula guidelines, handling, and paced feeding. Discussed baby behavior, feeding frequency, baby belly size, voids, wt loss, breast changes, and nipple care. Given lactation and formula handouts. Aware of OP services and support group. Mom will offer the breast on demand, post express, and spoon feed per volume guidelines. If she uses formula it will by spoon and volume guidelines.    Maternal Data    Feeding Feeding Type: Breast Fed Length of feed: 20 min  LATCH Score Latch: Grasps breast easily, tongue down, lips flanged, rhythmical sucking.  Audible Swallowing: A few with stimulation  Type of Nipple: Everted at rest and after stimulation  Comfort (Breast/Nipple): Filling, red/small blisters or bruises, mild/mod discomfort  Hold (Positioning): Assistance needed to correctly position infant at breast and maintain latch.  LATCH Score: 7  Interventions Interventions: Breast feeding basics reviewed;Assisted with latch;Skin to skin;Breast massage;Hand express;Position options;Support pillows  Lactation Tools Discussed/Used WIC Program: Yes   Consult Status Consult Status: Follow-up Date: 07/17/17 Follow-up type: In-patient    Rulon Eisenmenger 07/16/2017, 12:25 PM

## 2017-07-17 ENCOUNTER — Inpatient Hospital Stay (HOSPITAL_COMMUNITY): Admission: RE | Admit: 2017-07-17 | Payer: Self-pay | Source: Ambulatory Visit

## 2017-07-17 DIAGNOSIS — O34219 Maternal care for unspecified type scar from previous cesarean delivery: Secondary | ICD-10-CM | POA: Diagnosis not present

## 2017-07-17 MED ORDER — IBUPROFEN 600 MG PO TABS: 600.0000 mg | ORAL_TABLET | Freq: Four times a day (QID) | ORAL | 0 refills | Status: DC

## 2017-07-17 MED ORDER — ACETAMINOPHEN 325 MG PO TABS: 650.0000 mg | ORAL_TABLET | ORAL | 1 refills | Status: DC | PRN

## 2017-07-17 NOTE — Lactation Note (Signed)
This note was copied from a baby's chart. Lactation Consultation Note  Patient Name: Girl Valoria Tamburri [id number] Date: 07/17/2017 Reason for consult: Follow-up assessment   Follow up with mom of 52 hour old infant. Spoke with mom with assistance of [employer] Spanish Interpreter [place], [place] [phone], hospital interpreter busy at this time.  Infant with 8 BF for 15-60 minutes, 1 formula feed of 30 cc, 4 voids and 7 stool in last 24 hours. Infant weight 7 lb 12.2 oz with 6% weight loss since birth. LATCH Scores 7-9.   Mom was concerned that she is passing clots, Kelby Aline, RN was called to room by [facility] to assess.   Mom reports she feels BF is going well. She reports she feels she has very little milk. Enc mom to feed infant STS 8-12 x in 24 hours at first feeding cues. Discussed milk coming to volume 3-5 days post delivery.   Reviewed I/O, Engorgement prevention/treatment, and breast milk handling and storage. Mom has a manual pump for home use. Infant with follow up Ped appt on Friday. Mom has spoken with [facility] and has follow up appt with them.   [facility] Brochure given, mom informed of OP Services, BF Support Groups and LC phone #. Enc mom to call for any questions/concerns at this time. Mom declined need for [facility] assistance at this time.    Maternal Data Formula Feeding for Exclusion: Yes Has patient been taught Hand Expression?: Yes  Feeding    LATCH Score                   Interventions    Lactation Tools Discussed/Used WIC Program: Yes Pump Review: Milk Storage   Consult Status Consult Status: Complete Follow-up type: Call as needed    Ed Blalock 07/17/2017, 11:04 AM

## 2017-07-17 NOTE — Discharge Instructions (Signed)
Schedule a 6 week follow up visit with your primary OB doctor.  Parto vaginal, cuidados posteriores (Vaginal Delivery, Care After) Siga estas instrucciones durante las prximas semanas. Estas indicaciones le proporcionan informacin acerca de cmo deber cuidarse despus del parto. El mdico tambin podr darle instrucciones ms especficas. El tratamiento ha sido planificado segn las prcticas mdicas actuales, pero en algunos casos pueden ocurrir problemas. Llame al mdico si tiene problemas o preguntas. QU ESPERAR DESPUS DEL PARTO Despus de un parto vaginal, es frecuente tener lo siguiente:  Hemorragia leve de la vagina.  Dolor en el abdomen, la vagina y la zona de la piel entre la abertura vaginal y el ano (perineo).  Calambres plvicos.  Fatiga. INSTRUCCIONES PARA EL CUIDADO EN EL HOGAR Medicamentos  [employer] de venta libre y los recetados solamente como se lo haya indicado el mdico.  Si le recetaron un antibitico, tmelo como se lo haya indicado el mdico. No interrumpa la administracin del antibitico hasta que lo haya terminado. Conducir  No conduzca ni opere maquinaria pesada mientras toma analgsicos recetados.  No conduzca durante 24horas si le administraron un sedante. Estilo de vida  No beba alcohol. Esto es de suma importancia si est amamantando o toma analgsicos.  No consuma productos que contengan tabaco, incluidos cigarrillos, tabaco de Theatre manager o cigarrillos electrnicos. Si necesita ayuda para dejar de fumar, consulte al mdico. Comida y bebida  Beba al menos 8vasos de 8onzas (240cc) de agua todos los [address]  [address] a menos que el mdico le indique lo contrario. Si elige amamantar al beb, quiz deba beber an ms cantidad de agua.  Ingiera alimentos ricos en [employer]. Estos alimentos pueden ayudarla a prevenir o Educational psychologist. Los alimentos ricos en fibras incluyen, entre otros: ? Panes y cereales integrales. ? Arroz  integral. ? Armed forces operational officer. ? Nils Pyle y verduras frescas. Actividad  Reanude sus actividades normales como se lo haya indicado el mdico. Pregntele al mdico qu actividades son seguras para usted.  Descanse todo lo que pueda. Trate de descansar o tomar una siesta mientras el beb est durmiendo.  No levante objetos que pesen ms de 10libras (4,5kg) hasta que el mdico le diga que es seguro [place].  Hable con el mdico sobre cundo puede volver a Management consultant. Esto puede depender de lo siguiente: ? Riesgo de sufrir infecciones. ? Velocidad de cicatrizacin. ? Comodidad y deseo de Management consultant. Cuidados vaginales  Si le realizaron una episiotoma o tuvo un desgarro vaginal, contrlese la zona todos los [place] para detectar signos de infeccin. Est atenta a los siguientes signos: ? Aumento del enrojecimiento, la hinchazn o Chief Technology Officer. ? Ms lquido [place]. ? Calor. ? Pus o mal olor.  No use tampones ni se haga duchas vaginales hasta que el mdico la autorice.  Controle la sangre que elimina por la vagina para detectar cogulos. Pueden tener el aspecto de grumos de color rojo oscuro, marrn o negro. Instrucciones generales  Mantenga el perineo limpio y seco, como se lo haya indicado el mdico.  Use ropa cmoda y suelta.  Cuando vaya al bao, siempre higiencese de adelante [employer].  Pregntele al mdico si puede ducharse o tomar baos de inmersin. Si se le realiz una episiotoma o tuvo un desgarro perineal durante el trabajo del parto o el parto, es posible que el mdico le indique que no tome baos de inmersin durante un determinado tiempo.  Use un sostn que sujete y ajuste bien sus pechos.  Si es  posible, pdale a alguien que la ayude con las tareas del hogar y a Scientist, product/process development del beb durante al menos algunos das despus de salir del hospital.  Chauncy Passy a todas las visitas de seguimiento para usted y el beb, como se lo haya indicado el mdico. Esto es  importante. SOLICITE ATENCIN MDICA SI:  Tiene los siguientes sntomas: ? Secrecin vaginal que tiene mal olor. ? Dificultad para orinar. ? Dolor al [employer]. ? Aumento o disminucin repentinos de la frecuencia con que defeca. ? Ms enrojecimiento, hinchazn o dolor alrededor de la episiotoma o del desgarro vaginal. ? Ms secrecin de lquido o sangre de la episiotoma o desgarro vaginal. ? Pus o mal olor proveniente de la episiotoma o el desgarro vaginal. ? Grant Ruts. ? Erupcin cutnea. ? Poco inters o falta de inters en actividades que solan gustarle. ? Dudas sobre su cuidado y el del beb.  Siente la episiotoma o el desgarro vaginal caliente al tacto.  La episiotoma o el desgarro vaginal se est abriendo o no Adult nurse.  Siente dolor en las [place], o estn duras o enrojecidas.  Siente tristeza o preocupacin de forma inusual.  Siente nuseas o vomita.  Elimina cogulos grandes por la vagina. Si expulsa un cogulo sanguneo por la vagina, gurdelo para mostrrselo a su mdico. No tire la cadena sin que el mdico examine el cogulo antes.  Orina ms de lo habitual.  Se siente mareada o se desmaya.  No ha amamantado para nada y no ha tenido un perodo menstrual durante 12 semanas despus del [place].  Dej de amamantar al beb y no ha tenido su perodo menstrual durante 12 semanas despus de dejar de Museum/gallery exhibitions officer.  SOLICITE ATENCIN MDICA DE INMEDIATO SI:  Tiene los siguientes sntomas: ? Dolor que no desaparece o no mejora con el medicamento. ? Journalist, newspaper. ? Dificultad para respirar. ? Visin borrosa o Nurse, adult. ? Pensamientos de autolesionarse o lesionar al beb.  Comienza a Psychiatrist abdomen o en una de las piernas.  Dolor de cabeza intenso.  Se desmaya.  Tiene una hemorragia tan intensa de la vagina que empapa dos toallitas sanitarias en [place].  Esta informacin no tiene Theme park manager el consejo del mdico. Asegrese  de hacerle al mdico cualquier pregunta que tenga. Document Released: 10/14/2005 Document Revised: 02/05/2016 Document Reviewed: 10/29/2015 Elsevier Interactive Patient Education  2017 [employer].

## 2017-07-17 NOTE — Discharge Summary (Signed)
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 yo [id number] at [redacted]w[redacted]d was admitted in Latent Labor on 07/14/2017. Patient had an uncomplicated labor course as follows:  Membrane Rupture Time/Date: 1:00 PM ,07/14/2017   Intrapartum Procedures: Episiotomy: None [1]                                         Lacerations:  2nd degree [3];Perineal [11]  Patient had a delivery of a Viable infant. 07/15/2017  Information for the patient's newborn:  Eleanore Junio, Girl Christasia [[id number]]  Delivery Method: VBAC, Spontaneous (Filed from Delivery Summary)    Pateint had an uncomplicated postpartum course.  She is ambulating, tolerating a regular diet, passing flatus, and urinating well. Patient is discharged home in stable condition on 07/17/17.   Physical exam  Vitals:   07/15/17 2130 07/16/17 0625 07/16/17 1756 07/17/17 0524  BP: 119/61 120/65 121/71 116/65  Pulse: 86 90 82 77  Resp: Temp: 98.9 F (37.2 C) 97.7 F (36.5 C) 98.3 F (36.8 C) 99.1 F (37.3 C)  TempSrc: Oral Oral Oral Oral  SpO2: 98%     Weight:      Height:       General:  alert, cooperative and no distress Lochia: appropriate Uterine Fundus: firm Incision: N/A DVT Evaluation: No evidence of DVT seen on physical exam. Labs: Lab Results  Component Value Date   WBC 10.5 07/14/2017   HGB 13.6 07/14/2017   HCT 39.7 07/14/2017   MCV 88.4 07/14/2017   PLT 163 07/14/2017   No flowsheet data found.  Discharge instruction: per After Visit Summary and "Baby and Me Booklet".  After visit meds:  Allergies as of 07/17/2017   No Known Allergies     Medication List    TAKE these medications   acetaminophen 325 MG tablet Commonly known as:  TYLENOL Take 2 tablets (650 mg total) by mouth every 4 (four) hours as needed (for pain scale < 4).   ibuprofen 600 MG tablet Commonly known as:  ADVIL,MOTRIN Take 1 tablet (600 mg total)  by mouth every 6 (six) hours.   prenatal multivitamin Tabs tablet Take 1 tablet by mouth daily at 12 noon.            Discharge Care Instructions        Start     Ordered   07/17/17 0000  acetaminophen (TYLENOL) 325 MG tablet  Every 4 hours PRN     07/17/17 0739   07/17/17 0000  ibuprofen (ADVIL,MOTRIN) 600 MG tablet  Every 6 hours     07/17/17 0739   07/17/17 0000  Increase activity slowly     07/17/17 0739   07/17/17 0000  Diet - low sodium heart healthy     07/17/17 0739      Diet: routine diet  Activity: Advance as tolerated. Pelvic rest for 6 weeks.   Outpatient follow up:6 weeks Follow up Appt:No future appointments. Follow up Visit:No Follow-up on file.  Postpartum contraception: Condoms  Newborn Data: Live born female  Birth Weight: 8 lb 4.5 oz (3756 g) APGAR: 9, 9  Baby Feeding: Bottle and Breast Disposition:home with mother   07/17/2017 Burnard Leigh, MD   CNM attestation I have seen and examined this patient and agree with above documentation in the resident's note.   Jamie Hess is a 21 y.o. [phone] s/p VBAC.   Pain is well controlled.  Plan for birth control is condoms.  Method of  Feeding: both  PE:  BP 116/65 (BP Location: Right Arm)   Pulse 77   Temp 99.1 F (37.3 C) (Oral)   Resp 16   Ht  (1.575 m)   Wt 77.6 kg (171 lb)   LMP 10/02/2016   SpO2 98%   Breastfeeding? Unknown   BMI 31.28 kg/m  Fundus firm   Recent Labs  07/14/17 1558  HGB 13.6  HCT 39.7     Plan: discharge today - postpartum care discussed - f/u clinic in 4 weeks for postpartum visit   Cam Hai, CNM 9:50 AM 07/17/2017

## 2018-01-19 ENCOUNTER — Ambulatory Visit: Payer: Self-pay | Attending: Nurse Practitioner | Admitting: Nurse Practitioner

## 2018-01-19 ENCOUNTER — Encounter: Payer: Self-pay | Admitting: Nurse Practitioner

## 2018-01-19 VITALS — BP 111/73 | HR 75 | Temp 98.2°F | Ht 64.0 in | Wt 158.2 lb

## 2018-01-19 DIAGNOSIS — Z9889 Other specified postprocedural states: Secondary | ICD-10-CM | POA: Insufficient documentation

## 2018-01-19 DIAGNOSIS — H5789 Other specified disorders of eye and adnexa: Secondary | ICD-10-CM | POA: Insufficient documentation

## 2018-01-19 DIAGNOSIS — Z0000 Encounter for general adult medical examination without abnormal findings: Secondary | ICD-10-CM | POA: Insufficient documentation

## 2018-01-19 MED ORDER — OLOPATADINE HCL 0.2 % OP SOLN: OPHTHALMIC | 0 refills | Status: DC

## 2018-01-19 NOTE — Progress Notes (Signed)
Assessment & Plan:  Jamie HesselbachMaria was seen today for establish care and eye problem.  Diagnoses and all orders for this visit:  Routine adult health maintenance -     CBC -     Basic metabolic panel  Eye irritation -     Olopatadine HCl 0.2 % SOLN; Apply one drop to each eye daily as instructed.   Patient has been counseled on age-appropriate routine health concerns for screening and prevention. These are reviewed and up-to-date. Referrals have been placed accordingly. Immunizations are up-to-date or declined.    Subjective:   Chief Complaint  Patient presents with  . Establish Care    Pt is here to establish care.   . Eye Problem    Pt. stated when air hit her eyes, she get iritation.    HPI Jamie Hess 22 y.o. female presents to office today to establish care. VRI was used to communicate directly with patient for the entire encounter including providing detailed patient instructions.    Eye Problem She has not seen in ophthalmologist in the past. She endorses swelling and redness in both of her eyes in the past (one month ago). She used an over the counter eye drop (lubricant) in the past which relieved the redness, swelling and irritation in her eyes. Today she reports symptoms of itching and eye swelling in both eyes.  However on eye exam there is no redness. She reports she was told in the past to take allergy medication for her symptoms and she took them for 15 days with no relief of symptoms.    Review of Systems  Constitutional: Negative for fever, malaise/fatigue and weight loss.  HENT: Negative.  Negative for ear discharge, ear pain and nosebleeds.   Eyes: Positive for pain. Negative for blurred vision, double vision, photophobia, discharge and redness.       SEE HPI  Respiratory: Negative.  Negative for cough and shortness of breath.   Cardiovascular: Negative.  Negative for chest pain, palpitations and leg swelling.  Gastrointestinal: Negative.  Negative for  heartburn, nausea and vomiting.  Genitourinary: Negative.   Neurological: Negative.  Negative for dizziness, focal weakness, seizures and headaches.  Psychiatric/Behavioral: Negative.  Negative for suicidal ideas.    Past Medical History:  Diagnosis Date  . 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 CESAREAN SECTION      Family History  Problem Relation Age of Onset  . Hearing loss Daughter   . Diabetes Mother   . Diabetes Paternal Grandmother   . Diabetes Paternal Grandfather     Social History Reviewed with no changes to be made today.   Outpatient Medications Prior to Visit  Medication Sig Dispense Refill  . Prenatal Vit-Fe Fumarate-FA (PRENATAL MULTIVITAMIN) TABS tablet Take 1 tablet by mouth daily at 12 noon.    Marland Kitchen. acetaminophen (TYLENOL) 325 MG tablet Take 2 tablets (650 mg total) by mouth every 4 (four) hours as needed (for pain scale < 4). (Patient not taking: Reported on 01/19/2018) 30 tablet 1  . ibuprofen (ADVIL,MOTRIN) 600 MG tablet Take 1 tablet (600 mg total) by mouth every 6 (six) hours. (Patient not taking: Reported on 01/19/2018) 30 tablet 0   No facility-administered medications prior to visit.     No Known Allergies     Objective:    BP 111/73 (  BP Location: Left Arm, Patient Position: Sitting, Cuff Size: Normal)   Pulse 75   Temp 98.2 F (36.8 C) (Oral)   Ht 5\' 4"  (1.626 m)   Wt 158 lb 3.2 oz (71.8 kg)   SpO2 97%   BMI 27.15 kg/m  Wt Readings from Last 3 Encounters:  01/19/18 158 lb 3.2 oz (71.8 kg)  07/14/17 171 lb (77.6 kg)  07/12/17 171 lb 12 oz (77.9 kg)    Physical Exam  Constitutional: She is oriented to person, place, and time. She appears well-developed and well-nourished. She is cooperative.  HENT:  Head: Normocephalic and atraumatic.  Eyes: Pupils are  equal, round, and reactive to light. Conjunctivae, EOM and lids are normal. Right eye exhibits no chemosis, no discharge, no exudate and no hordeolum. No foreign body present in the right eye. Left eye exhibits no chemosis, no discharge, no exudate and no hordeolum. No foreign body present in the left eye. No scleral icterus.  Neck: Normal range of motion.  Cardiovascular: Normal rate, regular rhythm and normal heart sounds. Exam reveals no gallop and no friction rub.  No murmur heard. Pulmonary/Chest: Effort normal and breath sounds normal. No tachypnea. No respiratory distress. She has no decreased breath sounds. She has no wheezes. She has no rhonchi. She has no rales. She exhibits no tenderness.  Abdominal: Bowel sounds are normal.  Musculoskeletal: Normal range of motion. She exhibits no edema.  Neurological: She is alert and oriented to person, place, and time. Coordination normal.  Skin: Skin is warm and dry.  Psychiatric: She has a normal mood and affect. Her behavior is normal. Judgment and thought content normal.  Nursing note and vitals reviewed.        Patient has been counseled extensively about nutrition and exercise as well as the importance of adherence with medications and regular follow-up. The patient was given clear instructions to go to ER or return to medical center if symptoms don't improve, worsen or new problems develop. The patient verbalized understanding.   Follow-up: Return in about 3 months (around 04/21/2018) for PAP SMEAR, Needs appointment with financial representative.Claiborne Rigg, FNP-BC [facility] [place], [place] [phone]   01/19/2018, 2:23 PM

## 2018-01-20 LAB — BASIC METABOLIC PANEL
BUN/Creatinine Ratio: 16 (ref 9–23)
BUN: 10 mg/dL (ref 6–20)
CALCIUM: 9.3 mg/dL (ref 8.7–10.2)
CO2: 20 mmol/L (ref 20–29)
Chloride: 108 mmol/L — ABNORMAL HIGH (ref 96–106)
Creatinine, Ser: 0.64 mg/dL (ref 0.57–1.00)
GFR, EST AFRICAN AMERICAN: 145 mL/min/{1.73_m2} (ref >59)
GFR, EST NON AFRICAN AMERICAN: 126 mL/min/{1.73_m2} (ref >59)
Glucose: 77 mg/dL (ref 65–99)
POTASSIUM: 4.4 mmol/L (ref 3.5–5.2)
SODIUM: 145 mmol/L — AB (ref 134–144)

## 2018-01-20 LAB — CBC
HEMATOCRIT: 36.6 % (ref 34.0–46.6)
Hemoglobin: 11.9 g/dL (ref 11.1–15.9)
MCH: 27.6 pg (ref 26.6–33.0)
MCHC: 32.5 g/dL (ref 31.5–35.7)
MCV: 85 fL (ref 79–97)
PLATELETS: 276 10*3/uL (ref 150–379)
RBC: 4.31 x10E6/uL (ref 3.77–5.28)
RDW: 14.4 % (ref 12.3–15.4)
WBC: 5.1 10*3/uL (ref 3.4–10.8)

## 2018-01-27 ENCOUNTER — Telehealth: Payer: Self-pay

## 2018-01-27 NOTE — Telephone Encounter (Signed)
-----   Message from Claiborne RiggZelda W Fleming, NP sent at 01/25/2018  2:01 PM EDT ----- Labs are essentially normal. Make sure you are drinking at least 48 oz of water per day. Work on eating a low fat, heart healthy diet and participate in regular aerobic exercise program to control as well. Exercise at least 150 minutes per week. Avoid adding salt to your foods and eating take out foods which are high in salt.

## 2018-01-27 NOTE — Telephone Encounter (Signed)
CMA spoke to patient to inform on lab results and PCP advising.  Spanish interpreter Blossom Hoopslejandro [phone] assist with the call.  Patient verified DOB and Understood.

## 2018-04-21 ENCOUNTER — Other Ambulatory Visit: Payer: Self-pay | Admitting: Nurse Practitioner

## 2018-05-09 ENCOUNTER — Emergency Department (HOSPITAL_COMMUNITY): Payer: Medicaid Other

## 2018-05-09 ENCOUNTER — Observation Stay (HOSPITAL_COMMUNITY)
Admission: EM | Admit: 2018-05-09 | Discharge: 2018-05-10 | Disposition: A | Discharge status: Home / Self Care | Payer: Medicaid Other | Attending: General Surgery | Admitting: General Surgery

## 2018-05-09 ENCOUNTER — Other Ambulatory Visit: Payer: Self-pay

## 2018-05-09 ENCOUNTER — Encounter (HOSPITAL_COMMUNITY): Payer: Self-pay | Admitting: Emergency Medicine

## 2018-05-09 DIAGNOSIS — K436 Other and unspecified ventral hernia with obstruction, without gangrene: Secondary | ICD-10-CM

## 2018-05-09 DIAGNOSIS — K420 Umbilical hernia with obstruction, without gangrene: Principal | ICD-10-CM | POA: Insufficient documentation

## 2018-05-09 DIAGNOSIS — K56609 Unspecified intestinal obstruction, unspecified as to partial versus complete obstruction: Secondary | ICD-10-CM

## 2018-05-09 DIAGNOSIS — R1084 Generalized abdominal pain: Secondary | ICD-10-CM

## 2018-05-09 LAB — URINALYSIS, ROUTINE W REFLEX MICROSCOPIC
Bilirubin Urine: NEGATIVE
GLUCOSE, UA: NEGATIVE mg/dL
HGB URINE DIPSTICK: NEGATIVE
Ketones, ur: NEGATIVE mg/dL
Leukocytes, UA: NEGATIVE
NITRITE: NEGATIVE
PH: 6 (ref 5.0–8.0)
PROTEIN: 30 mg/dL — AB
Specific Gravity, Urine: 1.036 — ABNORMAL HIGH (ref 1.005–1.030)

## 2018-05-09 LAB — COMPREHENSIVE METABOLIC PANEL
ALT: 14 U/L (ref 0–44)
ANION GAP: 11 (ref 5–15)
AST: 15 U/L (ref 15–41)
Albumin: 4.3 g/dL (ref 3.5–5.0)
Alkaline Phosphatase: 89 U/L (ref 38–126)
BILIRUBIN TOTAL: 0.5 mg/dL (ref 0.3–1.2)
BUN: 12 mg/dL (ref 6–20)
CO2: 24 mmol/L (ref 22–32)
Calcium: 9.2 mg/dL (ref 8.9–10.3)
Chloride: 105 mmol/L (ref 98–111)
Creatinine, Ser: 0.96 mg/dL (ref 0.44–1.00)
GFR calc non Af Amer: >60 mL/min (ref >60)
Glucose, Bld: 106 mg/dL — ABNORMAL HIGH (ref 70–99)
Potassium: 3.9 mmol/L (ref 3.5–5.1)
Sodium: 140 mmol/L (ref 135–145)
TOTAL PROTEIN: 7.3 g/dL (ref 6.5–8.1)

## 2018-05-09 LAB — CBC
HCT: 42.3 % (ref 36.0–46.0)
HEMOGLOBIN: 13.1 g/dL (ref 12.0–15.0)
MCH: 26.4 pg (ref 26.0–34.0)
MCHC: 31 g/dL (ref 30.0–36.0)
MCV: 85.3 fL (ref 78.0–100.0)
Platelets: 284 10*3/uL (ref 150–400)
RBC: 4.96 MIL/uL (ref 3.87–5.11)
RDW: 14.4 % (ref 11.5–15.5)
WBC: 10.6 10*3/uL — ABNORMAL HIGH (ref 4.0–10.5)

## 2018-05-09 LAB — I-STAT BETA HCG BLOOD, ED (MC, WL, AP ONLY): I-stat hCG, quantitative: <5 m[IU]/mL (ref <5)

## 2018-05-09 LAB — I-STAT CG4 LACTIC ACID, ED: Lactic Acid, Venous: 0.48 mmol/L — ABNORMAL LOW (ref 0.5–1.9)

## 2018-05-09 LAB — LIPASE, BLOOD: Lipase: 38 U/L (ref 11–51)

## 2018-05-09 MED ORDER — KETOROLAC TROMETHAMINE 15 MG/ML IJ SOLN
15.0000 mg | Freq: Once | INTRAMUSCULAR | Status: AC
  Administered 2018-05-09: 15 mg via INTRAVENOUS
  Filled 2018-05-09: qty 1

## 2018-05-09 MED ORDER — CEFAZOLIN SODIUM-DEXTROSE 2-4 GM/100ML-% IV SOLN
2.0000 g | Freq: Once | INTRAVENOUS | Status: AC
  Administered 2018-05-10: 2 g via INTRAVENOUS
  Filled 2018-05-09: qty 100

## 2018-05-09 MED ORDER — MORPHINE SULFATE (PF) 4 MG/ML IV SOLN
4.0000 mg | Freq: Once | INTRAVENOUS | Status: AC
  Administered 2018-05-09: 4 mg via INTRAVENOUS
  Filled 2018-05-09: qty 1

## 2018-05-09 MED ORDER — SODIUM CHLORIDE 0.9 % IV BOLUS
1000.0000 mL | Freq: Once | INTRAVENOUS | Status: AC
  Administered 2018-05-09: 1000 mL via INTRAVENOUS

## 2018-05-09 MED ORDER — IOHEXOL 300 MG/ML  SOLN
100.0000 mL | Freq: Once | INTRAMUSCULAR | Status: AC | PRN
  Administered 2018-05-09: 100 mL via INTRAVENOUS

## 2018-05-09 NOTE — H&P (Signed)
Jamie Hess is an 22 y.o. female.    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 She states she has a known hernia for several years but hasn't bothered her until Sat. She c/o significant pain in the umbilical area and nausea but no f/c/v/d/c. Last BM was sat am. No prior abd sx except for c/s. No hematuria/vag dc. Some dysuria. Movment/coughing makes worse.    No tob/etoh/drugs.   Past Medical History:  Diagnosis Date  . 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 CESAREAN SECTION      Family History  Problem Relation Age of Onset  . Hearing loss Daughter   . Diabetes Mother   . Diabetes Paternal Grandmother   . Diabetes Paternal Grandfather    Social History:  reports that she has never smoked. She has never used smokeless tobacco. She reports that she does not drink alcohol or use drugs.  Allergies: No Known Allergies   (Not in a hospital admission)  Results for orders placed or performed during the hospital encounter of 05/09/18 (from the past 48 hour(s))  Lipase, blood     Status: None   Collection Time: 05/09/18  5:51 PM  Result Value Ref Range   Lipase 38 11 - 51 U/L    Comment: Performed at [facility], [place] [address], [place], [place] [address]  Comprehensive metabolic panel     Status: Abnormal   Collection Time: 05/09/18  5:51 PM  Result Value Ref Range   Sodium 140 135 - 145 mmol/L   Potassium 3.9 3.5 - 5.1 mmol/L   Chloride 105 98 - 111 mmol/L    Comment: Please note change in reference range.   CO2 24 22 - 32 mmol/L   Glucose, Bld 106 (H) 70 - 99 mg/dL    Comment: Please note change in reference range.   BUN 12 6 - 20  mg/dL    Comment: Please note change in reference range.   Creatinine, Ser 0.96 0.44 - 1.00 mg/dL   Calcium 9.2 8.9 - 10.3 mg/dL   Total Protein 7.3 6.5 - 8.1 g/dL   Albumin 4.3 3.5 - 5.0 g/dL   AST 15 15 - 41 U/L   ALT 14 0 - 44 U/L    Comment: Please note change in reference range.   Alkaline Phosphatase 89 38 - 126 U/L   Total Bilirubin 0.5 0.3 - 1.2 mg/dL   GFR calc non Af Amer >60 >60 mL/min   GFR calc Af Amer >60 >60 mL/min    Comment: (NOTE) The eGFR has been calculated using the CKD EPI equation. This calculation has not been validated in all clinical situations. eGFR's persistently <60 mL/min signify possible Chronic Kidney Disease.    Anion gap 11 5 - 15    Comment: Performed at [facility] [address], [place] [address]  CBC     Status: Abnormal   Collection Time: 05/09/18  5:51 PM  Result Value Ref Range   WBC 10.6 (H) 4.0 - 10.5 K/uL   RBC 4.96 3.87 - 5.11 MIL/uL   Hemoglobin 13.1 12.0 - 15.0 g/dL   HCT 42.3 36.0 - 46.0 %  MCV 85.3 78.0 - 100.0 fL   MCH 26.4 26.0 - 34.0 pg   MCHC 31.0 30.0 - 36.0 g/dL   RDW 14.4 11.5 - 15.5 %   Platelets 284 150 - 400 K/uL    Comment: Performed at [facility], [place] [address] [place], [place] [address]  Urinalysis, Routine w reflex microscopic     Status: Abnormal   Collection Time: 05/09/18  6:05 PM  Result Value Ref Range   Color, Urine YELLOW YELLOW   APPearance HAZY (A) CLEAR   Specific Gravity, Urine 1.036 (H) 1.005 - 1.030   pH 6.0 5.0 - 8.0   Glucose, UA NEGATIVE NEGATIVE mg/dL   Hgb urine dipstick NEGATIVE NEGATIVE   Bilirubin Urine NEGATIVE NEGATIVE   Ketones, ur NEGATIVE NEGATIVE mg/dL   Protein, ur 30 (A) NEGATIVE mg/dL   Nitrite NEGATIVE NEGATIVE   Leukocytes, UA NEGATIVE NEGATIVE   RBC / HPF 0-5 0 - 5 RBC/hpf   WBC, UA 0-5 0 - 5 WBC/hpf   Bacteria, UA RARE (A) NONE SEEN   Squamous Epithelial / LPF 6-10 0 - 5   Mucus PRESENT     Comment: Performed at [facility],  [place] [address], [place], [place] [address]  I-Stat beta hCG blood, ED     Status: None   Collection Time: 05/09/18  6:08 PM  Result Value Ref Range   I-stat hCG, quantitative <5.0 <5 mIU/mL   Comment 3            Comment:   GEST. AGE      CONC.  (mIU/mL)   <=1 WEEK        5 - 50     2 WEEKS       50 - 500     3 WEEKS       100 - 10,000     4 WEEKS     1,000 - 30,000        FEMALE AND NON-PREGNANT FEMALE:     LESS THAN 5 mIU/mL   I-Stat CG4 Lactic Acid, ED     Status: Abnormal   Collection Time: 05/09/18 10:15 PM  Result Value Ref Range   Lactic Acid, Venous 0.48 (L) 0.5 - 1.9 mmol/L   Ct Abdomen Pelvis W Contrast  Result Date: 05/09/2018 CLINICAL DATA:  Lower abdominal pain with nausea.  Prior C-section. EXAM: CT ABDOMEN AND PELVIS WITH CONTRAST TECHNIQUE: Multidetector CT imaging of the abdomen and pelvis was performed using the standard protocol following bolus administration of intravenous contrast. CONTRAST:  136m OMNIPAQUE IOHEXOL 300 MG/ML  SOLN COMPARISON:  None. FINDINGS: Lower chest: Motion degradation. Clear lung bases. Normal heart size without pericardial or pleural effusion. Hepatobiliary: Motion degradation continuing into the upper abdomen. Variant lateral segment left liver lobe extending in the left upper quadrant. Grossly normal gallbladder, biliary tract,. Pancreas: Normal, without mass or ductal dilatation. Spleen: Normal in size, without focal abnormality. Adrenals/Urinary Tract: Normal adrenal glands. Normal kidneys, without hydronephrosis. Normal urinary bladder. Stomach/Bowel: Normal stomach, without wall thickening. Normal colon, appendix, and terminal ileum. Proximal to mid small bowel loops are dilated and fluid-filled including at 3.4 cm. Small bowel dilatation continues to the level of a left paramidline ventral abdominal wall hernia, which contains a loop of obstructive bowel, including on 51/3. The distal small bowel is decompressed. There is minimal fluid suspected  around the obstructed bowel loop, but no specific evidence of complicating ischemia. Vascular/Lymphatic: Normal caliber of the aorta and branch  vessels. No abdominopelvic adenopathy. Reproductive: Normal uterus and adnexa. Other: Trace free pelvic fluid could be physiologic or related to the bowel obstruction. No free intraperitoneal air. Musculoskeletal: No acute osseous abnormality. IMPRESSION: 1. Small bowel obstruction secondary to a left paramidline ventral abdominal wall hernia. No specific evidence of complicating ischemia. 2. Motion degradation within the lower chest and upper abdomen. Electronically Signed   By: Abigail Miyamoto M.D.   On: 05/09/2018 21:03    Review of Systems  Constitutional: Negative for chills, fever and weight loss.  HENT: Negative for nosebleeds.   Eyes: Negative for blurred vision.  Respiratory: Negative for shortness of breath.   Cardiovascular: Negative for chest pain, palpitations, orthopnea and PND.       Denies DOE  Gastrointestinal: Positive for abdominal pain and nausea. Negative for diarrhea and vomiting.  Genitourinary: Negative for dysuria and hematuria.  Musculoskeletal: Negative.   Skin: Negative for itching and rash.  Neurological: Negative for dizziness, focal weakness, seizures, loss of consciousness and headaches.  Endo/Heme/Allergies: Does not bruise/bleed easily.  Psychiatric/Behavioral: The patient is not nervous/anxious.     Blood pressure 124/64, pulse 69, temperature 98.1 F (36.7 C), temperature source Oral, resp. rate 18, height 5' 3" (1.6 m), weight 71.7 kg (158 lb), SpO2 98 %, unknown if currently breastfeeding. Physical Exam  Vitals reviewed. Constitutional: She is oriented to person, place, and time. She appears well-developed and well-nourished. No distress.  Appears uncomfortable  HENT:  Head: Normocephalic and atraumatic.  Right Ear: External ear normal.  Left Ear: External ear normal.  Eyes: Conjunctivae are normal. No scleral  icterus.  Neck: Normal range of motion. Neck supple. No tracheal deviation present. No thyromegaly present.  Cardiovascular: Normal rate and normal heart sounds.  Respiratory: Effort normal and breath sounds normal. No stridor. No respiratory distress. She has no wheezes.  GI: Soft. She exhibits no distension. There is tenderness. There is guarding. There is no rebound. A hernia is present. Hernia confirmed positive in the ventral area.    Small bulge at umbilicus. Very TTP in periumbilical area. Hardly will allow deep palpation. +voluntary guarding. The bulge is soft not hard. Can't really attempt to reduce given pt pushing hand away.   Musculoskeletal: She exhibits no edema or tenderness.  Lymphadenopathy:    She has no cervical adenopathy.  Neurological: She is alert and oriented to person, place, and time. She exhibits normal muscle tone.  Skin: Skin is warm and dry. No rash noted. She is not diaphoretic. No erythema. No pallor.  Psychiatric: She has a normal mood and affect. Her behavior is normal. Judgment and thought content normal.     Assessment/Plan Incarcerated umbilical hernia SBO  SBO secondary to incarcerated umbilical hernia No sign of strangulation.  Exam is very limited   Therefore recommend going to OR for reduction and repair of umbilical hernia with dx laparoscopy to inspect bowel. Don't anticiptate strangulated bowel.  We discussed the etiology of umbilical hernias. We discussed the signs and symptoms of incarceration and strangulation.   We discussed the risk and benefits of surgery including but not limited to bleeding, infection, injury to surrounding structures, hernia recurrence, mesh complications, hematoma/seroma formation,  blood clot formation, urinary retention, post operative ileus, general anesthesia risk, abdominal pain.  We discussed the importance of avoiding heavy lifting and straining for a period of 4- 6 weeks.  The patient has elected to proceed  with diagnostic laparoscopy, OPEN REPAIR OF incarcerated UMBILICAL HERNIA, POSSIBLE MESH.  If concerns about infection  explained we would not use mesh  Leighton Ruff. Redmond Pulling, MD, FACS General, Bariatric, & Minimally Invasive Surgery [facility], [place]    Greer Pickerel, MD 05/09/2018, 11:58 PM

## 2018-05-09 NOTE — ED Triage Notes (Signed)
Patient from home, c/o abdominal pain with a history of an umbilical hernia 2 years ago. Pain 10/10 radiating from center out to both sides of her abdomen, worsens when sitting down. lbm this am.

## 2018-05-09 NOTE — ED Provider Notes (Signed)
[facility] EMERGENCY DEPARTMENT Provider Note   CSN: [id number] Arrival date & time: 05/09/18  1736   History   Chief Complaint Chief Complaint  Patient presents with  . Abdominal Pain    HPI Jamie Hess is a 22 y.o. female with no significant past medical history who presents to the ED with severe generalized stabbing abdominal pain that radiates into her left side that began earlier this afternoon. She reports that the pain started off gradually in the LLQ, but has progressed to an unbearable severity. She endorses nausea and dysuria, but denies vomiting, hematuria, vaginal bleeding, vaginal discharge, fevers, or chills. Her last bowel movement was yesterday and appeared normal. She has a 6 year history of an umbilical hernia that has never caused her pain before. Her only abdominal surgery was a C-section.  Past Medical History:  Diagnosis Date  . 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 VBAC (vaginal birth after Cesarean) 07/17/2017  . SVD (spontaneous vaginal delivery) 07/15/2017  . PROM (premature rupture of membranes) 07/14/2017  . History of cesarean delivery 04/15/2017  . Language barrier 04/15/2017  . 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 Family history of deafness and hearing loss 02/25/2017    Past Surgical History:  Procedure Laterality Date  . CESAREAN SECTION       OB History    Gravida  2   Para  2   Term  2   Preterm      AB      Living  2     SAB      TAB      Ectopic      Multiple  0   Live Births  1        Obstetric Comments  G1: 08/2011 c-section ? Abruption, 37wks, 3kg         Home Medications    Prior to Admission medications   Not on File    Family History Family History  Problem Relation Age  of Onset  . Hearing loss Daughter   . Diabetes Mother   . Diabetes Paternal Grandmother   . Diabetes Paternal Grandfather     Social History Social History   Tobacco Use  . Smoking status: Never Smoker  . Smokeless tobacco: Never Used  Substance Use Topics  . Alcohol use: No  . Drug use: No     Allergies   Patient has no known allergies.   Review of Systems Review of Systems  Constitutional: Negative for chills and fever.  HENT: Negative for rhinorrhea and sore throat.   Eyes: Negative for visual disturbance.  Respiratory: Negative for cough and shortness of breath.   Cardiovascular: Negative for chest pain and leg swelling.  Gastrointestinal: Positive for abdominal pain and nausea. Negative for blood in stool, constipation, diarrhea and vomiting.  Genitourinary: Positive for dysuria and flank pain. Negative for vaginal bleeding and vaginal discharge.  Musculoskeletal: Positive for back pain. Negative for neck pain.  Skin: Negative for rash and wound.  Neurological: Negative for weakness and headaches.     Physical Exam Updated Vital Signs BP (!) 132/97 (BP Location: Right Arm)   Pulse 85   Temp 98.1 F (36.7 C) (Oral)   Resp 20   Ht 5\' 3"  (1.6 m)   Wt  71.7 kg (158 lb)   LMP  (LMP Unknown)   SpO2 100%   BMI 27.99 kg/m   Physical Exam  Constitutional: She appears well-developed and well-nourished. She appears distressed.  HENT:  Head: Normocephalic and atraumatic.  Mouth/Throat: Oropharynx is clear and moist.  Eyes: Pupils are equal, round, and reactive to light. EOM are normal.  Cardiovascular: Normal rate and regular rhythm. Exam reveals no gallop and no friction rub.  No murmur heard. Pulmonary/Chest: Effort normal and breath sounds normal. No respiratory distress. She has no wheezes. She has no rhonchi. She has no rales.  Abdominal: Bowel sounds are normal. She exhibits no distension. There is generalized tenderness. There is guarding. There is no  rebound.  Umbilical hernia is tender and non-reducible. Left CVA tenderness.    ED Treatments / Results  Labs (all labs ordered are listed, but only abnormal results are displayed) Labs Reviewed  COMPREHENSIVE METABOLIC PANEL - Abnormal; Notable for the following components:      Result Value   Glucose, Bld 106 (*)    All other components within normal limits  CBC - Abnormal; Notable for the following components:   WBC 10.6 (*)    All other components within normal limits  URINALYSIS, ROUTINE W REFLEX MICROSCOPIC - Abnormal; Notable for the following components:   APPearance HAZY (*)    Specific Gravity, Urine 1.036 (*)    Protein, ur 30 (*)    Bacteria, UA RARE (*)    All other components within normal limits  LIPASE, BLOOD  I-STAT BETA HCG BLOOD, ED ([facility], [facility], [facility] ONLY)    EKG None  Radiology Ct Abdomen Pelvis W Contrast  Result Date: 05/09/2018 CLINICAL DATA:  Lower abdominal pain with nausea.  Prior C-section. EXAM: CT ABDOMEN AND PELVIS WITH CONTRAST TECHNIQUE: Multidetector CT imaging of the abdomen and pelvis was performed using the standard protocol following bolus administration of intravenous contrast. CONTRAST:  OMNIPAQUE IOHEXOL 300 MG/ML  SOLN COMPARISON:  None. FINDINGS: Lower chest: Motion degradation. Clear lung bases. Normal heart size without pericardial or pleural effusion. Hepatobiliary: Motion degradation continuing into the upper abdomen. Variant lateral segment left liver lobe extending in the left upper quadrant. Grossly normal gallbladder, biliary tract,. Pancreas: Normal, without mass or ductal dilatation. Spleen: Normal in size, without focal abnormality. Adrenals/Urinary Tract: Normal adrenal glands. Normal kidneys, without hydronephrosis. Normal urinary bladder. Stomach/Bowel: Normal stomach, without wall thickening. Normal colon, appendix, and terminal ileum. Proximal to mid small bowel loops are dilated and fluid-filled including at 3.4 cm. Small bowel  dilatation continues to the level of a left paramidline ventral abdominal wall hernia, which contains a loop of obstructive bowel, including on 51/3. The distal small bowel is decompressed. There is minimal fluid suspected around the obstructed bowel loop, but no specific evidence of complicating ischemia. Vascular/Lymphatic: Normal caliber of the aorta and branch vessels. No abdominopelvic adenopathy. Reproductive: Normal uterus and adnexa. Other: Trace free pelvic fluid could be physiologic or related to the bowel obstruction. No free intraperitoneal air. Musculoskeletal: No acute osseous abnormality. IMPRESSION: 1. Small bowel obstruction secondary to a left paramidline ventral abdominal wall hernia. No specific evidence of complicating ischemia. 2. Motion degradation within the lower chest and upper abdomen. Electronically Signed   By: Jeronimo Greaves M.D.   On: 05/09/2018 21:03    Procedures Procedures (including critical care time)  Medications Ordered in ED Medications  morphine 4 MG/ML injection 4 mg (4 mg Intravenous Given 05/09/18 1958)  ketorolac (TORADOL) 15 MG/ML injection  15 mg (15 mg Intravenous Given 05/09/18 1957)  sodium chloride 0.9 % bolus 1,000 mL (1,000 mLs Intravenous New Bag/Given 05/09/18 2000)  iohexol (OMNIPAQUE) 300 MG/ML solution 100 mL (100 mLs Intravenous Contrast Given 05/09/18 2021)     Initial Impression / Assessment and Plan / ED Course  I have reviewed the triage vital signs and the nursing notes.  Pertinent labs & imaging results that were available during my care of the patient were reviewed by me and considered in my medical decision making (see chart for details).  Jamie Hess is a 22 y.o. female with no significant past medical history who presents to the ED with severe generalized stabbing abdominal pain that radiates into her left side. On initial exam, she appears distressed and has severe generalized tenderness in her abdomen and left flank with a  non-reducible umbilical hernia. DDx includes incarcerated or strangulated hernia, SBO, abdominal infection including appendicitis, ovarian pathology including torsion or cyst rupture, nephrolithiasis, and pyelonephritis. Labs were significant for negative pregnancy test, negative lipase, equivocal white count of 10.6, and unremarkable UA. Pt's pain was treated with morphine and toradol. She was given 1L NS. CT abdomen showed small bowel obstruction secondary to a left paramidline ventral abdominal wall hernia without evidence of complicating ischemia. Discussed case with general surgery. Admit to general surgery.  Final Clinical Impressions(s) / ED Diagnoses   Final diagnoses:  Generalized abdominal pain  Small bowel obstruction (HCC)  Ventral hernia with bowel obstruction    ED Discharge Orders    None       Javel Hersh, Cathleen Cortieborah N, MD 05/09/18 2328    Gerhard MunchLockwood, Robert, MD 05/09/18 2349

## 2018-05-10 ENCOUNTER — Encounter (HOSPITAL_COMMUNITY)
Admission: EM | Disposition: A | Discharge status: Home / Self Care | Payer: Self-pay | Source: Home / Self Care | Attending: Emergency Medicine

## 2018-05-10 ENCOUNTER — Encounter (HOSPITAL_COMMUNITY): Payer: Self-pay | Admitting: Anesthesiology

## 2018-05-10 ENCOUNTER — Emergency Department (HOSPITAL_COMMUNITY): Payer: Medicaid Other | Admitting: Anesthesiology

## 2018-05-10 DIAGNOSIS — K420 Umbilical hernia with obstruction, without gangrene: Secondary | ICD-10-CM | POA: Diagnosis present

## 2018-05-10 HISTORY — PX: LAPAROSCOPY: SHX197

## 2018-05-10 HISTORY — PX: UMBILICAL HERNIA REPAIR: SHX196

## 2018-05-10 SURGERY — LAPAROSCOPY, DIAGNOSTIC
Anesthesia: General | Site: Abdomen

## 2018-05-10 MED ORDER — DOCUSATE SODIUM 100 MG PO CAPS
100.0000 mg | ORAL_CAPSULE | Freq: Two times a day (BID) | ORAL | Status: DC
  Administered 2018-05-10: 100 mg via ORAL
  Filled 2018-05-10: qty 1

## 2018-05-10 MED ORDER — TRAMADOL HCL 50 MG PO TABS: 50.0000 mg | ORAL_TABLET | Freq: Four times a day (QID) | ORAL | Status: DC | PRN

## 2018-05-10 MED ORDER — LACTATED RINGERS IV SOLN
INTRAVENOUS | Status: DC | PRN
  Administered 2018-05-10: 01:00:00 via INTRAVENOUS

## 2018-05-10 MED ORDER — PHENYLEPHRINE 40 MCG/ML (10ML) SYRINGE FOR IV PUSH (FOR BLOOD PRESSURE SUPPORT)
PREFILLED_SYRINGE | INTRAVENOUS | Status: DC | PRN
  Administered 2018-05-10: 40 ug via INTRAVENOUS

## 2018-05-10 MED ORDER — MIDAZOLAM HCL 5 MG/5ML IJ SOLN
INTRAMUSCULAR | Status: DC | PRN
  Administered 2018-05-10 (×2): 1 mg via INTRAVENOUS

## 2018-05-10 MED ORDER — LIDOCAINE 2% (20 MG/ML) 5 ML SYRINGE
INTRAMUSCULAR | Status: AC
  Filled 2018-05-10: qty 5

## 2018-05-10 MED ORDER — PANTOPRAZOLE SODIUM 40 MG PO TBEC: 40.0000 mg | DELAYED_RELEASE_TABLET | Freq: Every day | ORAL | Status: DC

## 2018-05-10 MED ORDER — ACETAMINOPHEN 500 MG PO TABS
1000.0000 mg | ORAL_TABLET | Freq: Four times a day (QID) | ORAL | Status: DC
  Administered 2018-05-10: 1000 mg via ORAL
  Filled 2018-05-10: qty 2

## 2018-05-10 MED ORDER — ONDANSETRON HCL 4 MG/2ML IJ SOLN: 4.0000 mg | Freq: Four times a day (QID) | INTRAMUSCULAR | Status: DC | PRN

## 2018-05-10 MED ORDER — ONDANSETRON HCL 4 MG/2ML IJ SOLN
INTRAMUSCULAR | Status: DC | PRN
  Administered 2018-05-10: 4 mg via INTRAVENOUS

## 2018-05-10 MED ORDER — ENOXAPARIN SODIUM 40 MG/0.4ML ~~LOC~~ SOLN: 40.0000 mg | SUBCUTANEOUS | Status: DC

## 2018-05-10 MED ORDER — HYDROMORPHONE HCL 1 MG/ML IJ SOLN: 0.2500 mg | INTRAMUSCULAR | Status: DC | PRN

## 2018-05-10 MED ORDER — ROCURONIUM BROMIDE 100 MG/10ML IV SOLN
INTRAVENOUS | Status: DC | PRN
  Administered 2018-05-10: 50 mg via INTRAVENOUS

## 2018-05-10 MED ORDER — DIPHENHYDRAMINE HCL 50 MG/ML IJ SOLN: 12.5000 mg | Freq: Four times a day (QID) | INTRAMUSCULAR | Status: DC | PRN

## 2018-05-10 MED ORDER — DIPHENHYDRAMINE HCL 12.5 MG/5ML PO ELIX: 12.5000 mg | ORAL_SOLUTION | Freq: Four times a day (QID) | ORAL | Status: DC | PRN

## 2018-05-10 MED ORDER — MIDAZOLAM HCL 2 MG/2ML IJ SOLN
INTRAMUSCULAR | Status: AC
  Filled 2018-05-10: qty 2

## 2018-05-10 MED ORDER — ONDANSETRON HCL 4 MG/2ML IJ SOLN
INTRAMUSCULAR | Status: AC
  Filled 2018-05-10: qty 2

## 2018-05-10 MED ORDER — ACETAMINOPHEN 500 MG PO TABS: 1000.0000 mg | ORAL_TABLET | Freq: Three times a day (TID) | ORAL | 0 refills | Status: DC | PRN

## 2018-05-10 MED ORDER — KETOROLAC TROMETHAMINE 30 MG/ML IJ SOLN
30.0000 mg | Freq: Three times a day (TID) | INTRAMUSCULAR | Status: DC
  Administered 2018-05-10: 30 mg via INTRAVENOUS
  Filled 2018-05-10: qty 1

## 2018-05-10 MED ORDER — DEXAMETHASONE SODIUM PHOSPHATE 10 MG/ML IJ SOLN
INTRAMUSCULAR | Status: AC
  Filled 2018-05-10: qty 1

## 2018-05-10 MED ORDER — DEXAMETHASONE SODIUM PHOSPHATE 10 MG/ML IJ SOLN
INTRAMUSCULAR | Status: DC | PRN
  Administered 2018-05-10: 10 mg via INTRAVENOUS

## 2018-05-10 MED ORDER — LIDOCAINE HCL (CARDIAC) PF 100 MG/5ML IV SOSY
PREFILLED_SYRINGE | INTRAVENOUS | Status: DC | PRN
  Administered 2018-05-10: 60 mg via INTRAVENOUS

## 2018-05-10 MED ORDER — SUGAMMADEX SODIUM 200 MG/2ML IV SOLN
INTRAVENOUS | Status: DC | PRN
  Administered 2018-05-10: 200 mg via INTRAVENOUS

## 2018-05-10 MED ORDER — KCL IN DEXTROSE-NACL 20-5-0.45 MEQ/L-%-% IV SOLN
INTRAVENOUS | Status: DC
  Administered 2018-05-10: 06:00:00 via INTRAVENOUS
  Filled 2018-05-10: qty 1000

## 2018-05-10 MED ORDER — SUGAMMADEX SODIUM 200 MG/2ML IV SOLN
INTRAVENOUS | Status: AC
  Filled 2018-05-10: qty 2

## 2018-05-10 MED ORDER — TRAMADOL HCL 50 MG PO TABS: 50.0000 mg | ORAL_TABLET | Freq: Four times a day (QID) | ORAL | 0 refills | Status: DC | PRN

## 2018-05-10 MED ORDER — ROCURONIUM BROMIDE 10 MG/ML (PF) SYRINGE
PREFILLED_SYRINGE | INTRAVENOUS | Status: AC
  Filled 2018-05-10: qty 10

## 2018-05-10 MED ORDER — BUPIVACAINE-EPINEPHRINE (PF) 0.25% -1:200000 IJ SOLN
INTRAMUSCULAR | Status: AC
  Filled 2018-05-10: qty 30

## 2018-05-10 MED ORDER — BUPIVACAINE-EPINEPHRINE 0.25% -1:200000 IJ SOLN
INTRAMUSCULAR | Status: DC | PRN
  Administered 2018-05-10: 30 mL

## 2018-05-10 MED ORDER — MORPHINE SULFATE (PF) 2 MG/ML IV SOLN: 1.0000 mg | INTRAVENOUS | Status: DC | PRN

## 2018-05-10 MED ORDER — 0.9 % SODIUM CHLORIDE (POUR BTL) OPTIME
TOPICAL | Status: DC | PRN
  Administered 2018-05-10: 1000 mL

## 2018-05-10 MED ORDER — SUCCINYLCHOLINE CHLORIDE 200 MG/10ML IV SOSY
PREFILLED_SYRINGE | INTRAVENOUS | Status: DC | PRN
  Administered 2018-05-10: 70 mg via INTRAVENOUS

## 2018-05-10 MED ORDER — SUCCINYLCHOLINE CHLORIDE 200 MG/10ML IV SOSY
PREFILLED_SYRINGE | INTRAVENOUS | Status: AC
  Filled 2018-05-10: qty 10

## 2018-05-10 MED ORDER — PHENYLEPHRINE 40 MCG/ML (10ML) SYRINGE FOR IV PUSH (FOR BLOOD PRESSURE SUPPORT)
PREFILLED_SYRINGE | INTRAVENOUS | Status: AC
  Filled 2018-05-10: qty 10

## 2018-05-10 MED ORDER — PROMETHAZINE HCL 25 MG/ML IJ SOLN
6.2500 mg | INTRAMUSCULAR | Status: DC | PRN
Start: 2018-05-10 — End: 2018-05-10

## 2018-05-10 MED ORDER — PROPOFOL 10 MG/ML IV BOLUS
INTRAVENOUS | Status: DC | PRN
  Administered 2018-05-10: 150 mg via INTRAVENOUS

## 2018-05-10 MED ORDER — OXYCODONE HCL 5 MG PO TABS: 5.0000 mg | ORAL_TABLET | Freq: Once | ORAL | Status: DC | PRN

## 2018-05-10 MED ORDER — OXYCODONE HCL 5 MG/5ML PO SOLN: 5.0000 mg | Freq: Once | ORAL | Status: DC | PRN

## 2018-05-10 MED ORDER — FENTANYL CITRATE (PF) 250 MCG/5ML IJ SOLN
INTRAMUSCULAR | Status: AC
  Filled 2018-05-10: qty 5

## 2018-05-10 MED ORDER — FENTANYL CITRATE (PF) 100 MCG/2ML IJ SOLN
INTRAMUSCULAR | Status: DC | PRN
  Administered 2018-05-10 (×2): 50 ug via INTRAVENOUS
  Administered 2018-05-10: 100 ug via INTRAVENOUS

## 2018-05-10 MED ORDER — ONDANSETRON 4 MG PO TBDP
4.0000 mg | ORAL_TABLET | Freq: Four times a day (QID) | ORAL | Status: DC | PRN
Start: 2018-05-10 — End: 2018-05-10
  Filled 2018-05-10: qty 1

## 2018-05-10 SURGICAL SUPPLY — 50 items
BLADE CLIPPER SURG (BLADE) IMPLANT
CANISTER SUCT 3000ML PPV (MISCELLANEOUS) ×3 IMPLANT
CHLORAPREP W/TINT 26ML (MISCELLANEOUS) ×3 IMPLANT
COVER SURGICAL LIGHT HANDLE (MISCELLANEOUS) ×3 IMPLANT
DECANTER SPIKE VIAL GLASS SM (MISCELLANEOUS) ×3 IMPLANT
DERMABOND ADHESIVE PROPEN (GAUZE/BANDAGES/DRESSINGS) ×2
DERMABOND ADVANCED (GAUZE/BANDAGES/DRESSINGS) ×2
DERMABOND ADVANCED .7 DNX12 (GAUZE/BANDAGES/DRESSINGS) ×1 IMPLANT
DERMABOND ADVANCED .7 DNX6 (GAUZE/BANDAGES/DRESSINGS) ×1 IMPLANT
DRAPE LAPAROSCOPIC ABDOMINAL (DRAPES) IMPLANT
DRAPE LAPAROTOMY 100X72 PEDS (DRAPES) IMPLANT
DRAPE UTILITY XL STRL (DRAPES) IMPLANT
DRAPE WARM FLUID 44X44 (DRAPE) IMPLANT
ELECT CAUTERY BLADE 6.4 (BLADE) ×3 IMPLANT
ELECT REM PT RETURN 9FT ADLT (ELECTROSURGICAL) ×3
ELECTRODE REM PT RTRN 9FT ADLT (ELECTROSURGICAL) ×1 IMPLANT
GLOVE BIO SURGEON STRL SZ7.5 (GLOVE) ×3 IMPLANT
GLOVE BIOGEL M STRL SZ7.5 (GLOVE) ×3 IMPLANT
GLOVE BIOGEL PI IND STRL 8 (GLOVE) ×1 IMPLANT
GLOVE BIOGEL PI INDICATOR 8 (GLOVE) ×2
GOWN STRL REUS W/ TWL LRG LVL3 (GOWN DISPOSABLE) ×3 IMPLANT
GOWN STRL REUS W/TWL 2XL LVL3 (GOWN DISPOSABLE) ×3 IMPLANT
GOWN STRL REUS W/TWL LRG LVL3 (GOWN DISPOSABLE) ×6
KIT BASIN OR (CUSTOM PROCEDURE TRAY) ×3 IMPLANT
KIT TURNOVER KIT B (KITS) ×3 IMPLANT
MARKER SKIN DUAL TIP RULER LAB (MISCELLANEOUS) ×3 IMPLANT
NEEDLE HYPO 25GX1X1/2 BEV (NEEDLE) ×3 IMPLANT
NS IRRIG 1000ML POUR BTL (IV SOLUTION) ×3 IMPLANT
PACK SURGICAL SETUP 50X90 (CUSTOM PROCEDURE TRAY) ×3 IMPLANT
PAD ARMBOARD 7.5X6 YLW CONV (MISCELLANEOUS) ×6 IMPLANT
PENCIL BUTTON HOLSTER BLD 10FT (ELECTRODE) ×3 IMPLANT
SCISSORS LAP 5X35 DISP (ENDOMECHANICALS) IMPLANT
SET IRRIG TUBING LAPAROSCOPIC (IRRIGATION / IRRIGATOR) IMPLANT
SHEARS HARMONIC ACE PLUS 36CM (ENDOMECHANICALS) IMPLANT
SLEEVE ENDOPATH XCEL 5M (ENDOMECHANICALS) ×3 IMPLANT
SPONGE LAP 18X18 X RAY DECT (DISPOSABLE) IMPLANT
SUT MNCRL AB 4-0 PS2 18 (SUTURE) ×3 IMPLANT
SUT NOVA NAB DX-16 0-1 5-0 T12 (SUTURE) ×3 IMPLANT
SUT VIC AB 3-0 SH 18 (SUTURE) ×3 IMPLANT
SYR BULB 3OZ (MISCELLANEOUS) ×3 IMPLANT
SYR CONTROL 10ML LL (SYRINGE) IMPLANT
TOWEL GREEN STERILE FF (TOWEL DISPOSABLE) ×3 IMPLANT
TRAY LAPAROSCOPIC MC (CUSTOM PROCEDURE TRAY) ×3 IMPLANT
TROCAR XCEL BLUNT TIP 100MML (ENDOMECHANICALS) IMPLANT
TROCAR XCEL NON-BLD 11X100MML (ENDOMECHANICALS) IMPLANT
TROCAR XCEL NON-BLD 5MMX100MML (ENDOMECHANICALS) ×3 IMPLANT
TUBE CONNECTING 12'X1/4 (SUCTIONS)
TUBE CONNECTING 12X1/4 (SUCTIONS) IMPLANT
TUBING INSUFFLATION (TUBING) ×3 IMPLANT
YANKAUER SUCT BULB TIP NO VENT (SUCTIONS) ×3 IMPLANT

## 2018-05-10 NOTE — Op Note (Signed)
Jamie Hess [id number] 1996-03-15 05/10/2018  Diagnostic laparoscopy Open Repair of Incarcerated Umbilical Hernia Procedure Note  Indications: Symptomatic incarcerated umbilical hernia   Pre-operative Diagnosis: incarcerated umbilical hernia   Post-operative Diagnosis: same   Surgeon: Mary Sella. Andrey Campanile, MD FACS   Assistants: none   Anesthesia: General endotracheal anesthesia and Local anesthesia 0.25%marcaine with epi   Procedure Details  The patient was seen in the Emergency Room. The risks, benefits, complications, treatment options, and expected outcomes were discussed with the patient. The possibilities of reaction to medication, pulmonary aspiration, perforation of viscus, bleeding, recurrent infection, hernia recurrence, the need for additional procedures, failure to diagnose a condition, and creating a complication requiring transfusion or operation were discussed with the patient. The patient concurred with the proposed plan, giving informed consent. The site of surgery properly noted/marked.    A Time Out was held and the above information confirmed. The patient received IV antibiotics  prior to the procedure.   The patient was placed supine. After establishing general anesthesia, I palpated the incarcerated umbilical hernia & was able to reduce it at that point. the abdomen was prepped and draped in standard fashion.  I decided to Optiview the patient to inspected the reduced small bowel.  A small 1 cm incision was made beneath the left subcostal margin.  Then using a 0 degree 5 mm laparoscope through a 5 mm trocar I advanced it through all layers of the abdominal wall and entered the abdominal cavity.  Pneumoperitoneum was smoothly established.  An angled scope was then placed.  The hernia had been reduced.  There were no adhesions to the anterior abdominal wall.  I inspected the small bowel.  It was viable.  There was a segment that was a little bit dilated but it did not have  a thickened wall or any evidence of ischemia.  There is no evidence of injury of where I Optiview the patient.  At this point I switched to an open procedure.     A curvilinear infraumbilical incision was created. Dissection was carried down to the hernia sac located above the fascia and was mobilized from surrounding structures. Intact fascia was identified circumferentially around the defect. Skin and soft tissue was mobilized from the surface of the fascia in a circumferential manner.  Hernia sac was excised and discarded.  The hernia measured 1.5 cm.  Because the fascial defect was relatively small and she had had small bowel incarcerated for time being I decided to primarily repair the fascia in a transverse fashion.  The fascia was then closed primarily with 5 interrupted 1-0-novafil sutures. The cavity was irrigated and additional local was infiltrated in the subcutaneous tissue and fascia.  I then returned laparoscopically briefly.  I reinspected the small bowel.  The section that had been herniated now appeared normal in color.  Local was infiltrated in bilateral lateral abdominal walls as a tap block.  Pneumoperitoneum was released.  The umbilical stalk was then tacked back down to the fascia with two 3-0 vicryl sutures. Hemostasis was confirmed. The soft tissue was irrigated and closed in layers with inverted interrupted 3-0 vicryl sutures for the deep dermis. The skin incision was closed with a 4-0 monocryl subcuticular closure.  Dermabond was applied to the skin incisions. Instrument, sponge, and needle counts were correct prior to closure and at the conclusion of the case.   Findings:  A 1.5 cm fascial defect   Estimated Blood Loss: Minimal   Drains: none   Implants: none  Complications: None; patient tolerated the procedure well.   Disposition: PACU - hemodynamically stable.   Condition: stable  Mary SellaEric M. Andrey CampanileWilson, MD, FACS General, Bariatric, & Minimally Invasive Surgery [facility] [place]

## 2018-05-10 NOTE — Discharge Instructions (Signed)
[facility]      [facility] °[phone] ° °OPEN ABDOMINAL SURGERY: POST OP INSTRUCTIONS ° °Always review your discharge instruction sheet given to you by the facility where your surgery was performed. ° °IF YOU HAVE DISABILITY OR FAMILY LEAVE FORMS, YOU MUST BRING THEM TO THE OFFICE FOR PROCESSING.  PLEASE DO NOT GIVE THEM TO YOUR DOCTOR. ° °1. A prescription for pain medication may be given to you upon discharge.  Take your pain medication as prescribed, if needed.  If narcotic pain medicine is not needed, then you may take acetaminophen (Tylenol) or ibuprofen (Advil) as needed. °2. Take your usually prescribed medications unless otherwise directed. °3. If you need a refill on your pain medication, please contact your pharmacy. They will contact our office to request authorization.  Prescriptions will not be filled after 5pm or on week-ends. °4. You should follow a light diet the first few days after arrival home, such as soup and crackers, pudding, etc.unless your doctor has advised otherwise. A high-fiber, low fat diet can be resumed as tolerated.   Be sure to include lots of fluids daily. Most patients will experience some swelling and bruising on the chest and neck area.  Ice packs will help.  Swelling and bruising can take several days to resolve °5. Most patients will experience some swelling and bruising in the area of the incision. Ice pack will help. Swelling and bruising can take several days to resolve..  °6. It is common to experience some constipation if taking pain medication after surgery.  Increasing fluid intake and taking a stool softener will usually help or prevent this problem from occurring.  A mild laxative (Milk of Magnesia or Miralax) should be taken according to package directions if there are no bowel movements after 48 hours. °7.  You may have steri-strips (small skin tapes) in place directly over the incision.  These strips should be left on the skin for 7-10 days.  If your  surgeon used skin glue on the incision, you may shower in 24 hours.  The glue will flake off over the next 2-3 weeks.  Any sutures or staples will be removed at the office during your follow-up visit. You may find that a light gauze bandage over your incision may keep your staples from being rubbed or pulled. You may shower and replace the bandage daily. °8. ACTIVITIES:  You may resume regular (light) daily activities beginning the next day--such as daily self-care, walking, climbing stairs--gradually increasing activities as tolerated.  You may have sexual intercourse when it is comfortable.  Refrain from any heavy lifting or straining until approved by your doctor. °a. You may drive when you no longer are taking prescription pain medication, you can comfortably wear a seatbelt, and you can safely maneuver your car and apply brakes °b. Return to Work: ___________________________________ °9. You should see your doctor in the office for a follow-up appointment approximately two weeks after your surgery.  Make sure that you call for this appointment within a day or two after you arrive home to insure a convenient appointment time. °OTHER INSTRUCTIONS:  °_____________________________________________________________ °_____________________________________________________________ ° °WHEN TO CALL YOUR DOCTOR: °1. Fever over 101.0 °2. Inability to urinate °3. Nausea and/or vomiting °4. Extreme swelling or bruising °5. Continued bleeding from incision. °6. Increased pain, redness, or drainage from the incision. °7. Difficulty swallowing or breathing °8. Muscle cramping or spasms. °9. Numbness or tingling in hands or feet or around lips. ° °The clinic staff is available to   answer your questions during regular business hours.  Please dont hesitate to call and ask to speak to one of the nurses if you have concerns.  For further questions, please visit [email or web address]  CIRUGIA LAPAROSCOPICA: INSTRUCCIONES DE  POST OPERATORIO.  Revise siempre los documentos que le entreguen en el lugar donde se ha hecho la [place].  SI USTED NECESITA DOCUMENTOS DE INCAPACIDAD (DISABLE) O DE PERMISO FAMILAR (FAMILY LEAVE) NECESITA TRAERLOS A LA OFICINA PARA QUE SEAN PROCESADOS. NO  SE LOS DE A SU DOCTOR. 1. A su alta del hospital se le dara una receta para Human resources officer. Tomela como ha sido recetada, si la necesita. Si no la necesita puede tomar, Acetaminofen (Tylenol) o Ibuprofen (Advil) para aliviar dolor moderado. 2. Continue tomando el resto de sus medicinas. 3. Si necesita rellenar la receta, llame a la farmacia. ellos contactan a nuestra oficina pidiendo autorizacion. Este tipo de receta no pueden ser [employer] de las  5pm o [employer] fines de [place]. 4. Con relacion a la dieta: debe ser [employer] primeros dias despues que llege a la casa. Ejemplo: sopas y galleticas. Tome bastante liquido esos dias. 5. La mayoria de los pacientes padecen de inflamacion y cambio de coloracion de la piel alrededor de las incisiones. esto toma dias en resolver.  pnerse una bolsa de hielo en el area affectada ayuda..  6. Es comun tambien tener un poco de estrenimiento si esta tomado medicinas para Chief Technology Officer. incremente la cantidad de liquidos a tomar y Engineer, production (Colace) esto previene el problema. Si ya tiene estrenimiento, es Designer, jewellery no ha defecado en 48 horas, puede tomar un laxativo (Milk of Magnesia or Miralax) uselo como el paquete le explica. 7.  A menos que se le diga algo diferente. Remueva el bendaje a las 24-48 horas despues dela [place]. y puede banarse en la ducha sin ningun problema. usted puede tener steri-strips (pequenas curitas transparentes en la piel puesta encima de la incision)  Estas banditas strips should be left on the skin for 7-10 days.   Si su cirujano puso pegamento encima de la incision usted puede banarse bajo la ducha en 24 horas. Este pegamento empezara a caerse en las proximas 2-3 semanas. Si  le pusieron suturas o presillas (grapos) estos seran quitados en su proxima cita en la oficina. Marland Kitchen a. ACTIVIDADES:  Puede hacer actividad ligera.  Como caminar , subir escaleras y poco a poco irlas incrementando tanto como las [place]. Puede tener relaciones sexuales cuando sea comfortable. No carge objetos pesados o haga esfuerzos que no sean aprovados por su doctor. b. Puede manejar en cuanto no esta tomando medicamentos fuertes (narcoticos) para Chief Technology Officer, pueda abrochar confortablemente el cinturon de seguridad, y pueda Personnel officer y usar los pedales de su vehiculo con seguridad. c. PUEDE REGRESAR A TRABAJAR  8. Debe ver a su doctor para una cita de seguimiento en 2-3 semanas despues de la [place].  9. OTRAS ISNSTRUCCIONES:___________________________________________________________________________________ Debbora Lacrosse A SU MEDICO: 10. FIEBRE mayor de  101.0 11. No produccion de [place]. 12. Sangramiento continue de la herida 13. Incremento de dolor, enrojecimientio o drenaje de la herida (incision) 14. Incremento de dolor abdominal.  The clinic staff is available to answer your questions during regular business hours.  Please dont hesitate to call and ask to speak to one of the nurses for clinical concerns.  If you have a medical emergency, go to the nearest emergency room or call 911.  A surgeon from [facility] is always on  call at the hospital. [address], [address], [place], [place]  [address] ? [address] [address], [place], [place]   [id number] [phone] ? [phone] ? FAX [phone]) [phone] Web site: [email or web address]

## 2018-05-10 NOTE — Anesthesia Preprocedure Evaluation (Addendum)
Anesthesia Evaluation  Patient identified by MRN, date of birth, ID band Patient awake  General Assessment Comment:Interpreter used  Reviewed: Allergy & Precautions, NPO status , Patient's Chart, lab work & pertinent test results  Airway Mallampati: III  TM Distance: >3 FB Neck ROM: Full    Dental no notable dental hx. (+) Teeth Intact, Dental Advisory Given   Pulmonary neg pulmonary ROS,    Pulmonary exam normal breath sounds clear to auscultation       Cardiovascular negative cardio ROS Normal cardiovascular exam Rhythm:Regular Rate:Normal     Neuro/Psych negative neurological ROS  negative psych ROS   GI/Hepatic Neg liver ROS, bowel obstruction   Endo/Other  negative endocrine ROS  Renal/GU negative Renal ROS     Musculoskeletal negative musculoskeletal ROS (+)   Abdominal   Peds  Hematology negative hematology ROS (+)   Anesthesia Other Findings INCARCERATED UMBILICAL HERNIA  Reproductive/Obstetrics hcg negative                            Anesthesia Physical Anesthesia Plan  ASA: II and emergent  Anesthesia Plan: General   Post-op Pain Management:    Induction: Intravenous  PONV Risk Score and Plan: 3 and Ondansetron, Dexamethasone, Midazolam and Treatment may vary due to age or medical condition  Airway Management Planned: Oral ETT  Additional Equipment:   Intra-op Plan:   Post-operative Plan: Extubation in OR  Informed Consent: I have reviewed the patients History and Physical, chart, labs and discussed the procedure including the risks, benefits and alternatives for the proposed anesthesia with the patient or authorized representative who has indicated his/her understanding and acceptance.   Dental advisory given  Plan Discussed with: CRNA  Anesthesia Plan Comments:         Anesthesia Quick Evaluation

## 2018-05-10 NOTE — Discharge Summary (Addendum)
[facility] Discharge Summary   Patient ID: Jamie Hess MRN: [id number] DOB/AGE: [birth year] 22 y.o.  Admit date: 05/09/2018 Discharge date: 05/10/2018  Admitting Diagnosis: Incarcerated umbilical hernia   Discharge Diagnosis Patient Active Problem List   Diagnosis Date Noted  . Incarcerated umbilical hernia 05/10/2018  . VBAC (vaginal birth after Cesarean) 07/17/2017  . SVD (spontaneous vaginal delivery) 07/15/2017  . PROM (premature rupture of membranes) 07/14/2017  . History of cesarean delivery 04/15/2017  . Language barrier 04/15/2017  . 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 Family history of deafness and hearing loss 02/25/2017    Consultants None   Imaging: Ct Abdomen Pelvis W Contrast  Result Date: 05/09/2018 CLINICAL DATA:  Lower abdominal pain with nausea.  Prior C-section. EXAM: CT ABDOMEN AND PELVIS WITH CONTRAST TECHNIQUE: Multidetector CT imaging of the abdomen and pelvis was performed using the standard protocol following bolus administration of intravenous contrast. CONTRAST:  OMNIPAQUE IOHEXOL 300 MG/ML  SOLN COMPARISON:  None. FINDINGS: Lower chest: Motion degradation. Clear lung bases. Normal heart size without pericardial or pleural effusion. Hepatobiliary: Motion degradation continuing into the upper abdomen. Variant lateral segment left liver lobe extending in the left upper quadrant. Grossly normal gallbladder, biliary tract,. Pancreas: Normal, without mass or ductal dilatation. Spleen: Normal in size, without focal abnormality. Adrenals/Urinary Tract: Normal adrenal glands. Normal kidneys, without hydronephrosis. Normal urinary bladder. Stomach/Bowel: Normal stomach, without wall thickening. Normal colon, appendix, and terminal ileum. Proximal to mid small bowel loops are dilated and fluid-filled including at 3.4 cm. Small bowel dilatation continues to the level of a left paramidline ventral abdominal wall hernia, which contains  a loop of obstructive bowel, including on 51/3. The distal small bowel is decompressed. There is minimal fluid suspected around the obstructed bowel loop, but no specific evidence of complicating ischemia. Vascular/Lymphatic: Normal caliber of the aorta and branch vessels. No abdominopelvic adenopathy. Reproductive: Normal uterus and adnexa. Other: Trace free pelvic fluid could be physiologic or related to the bowel obstruction. No free intraperitoneal air. Musculoskeletal: No acute osseous abnormality. IMPRESSION: 1. Small bowel obstruction secondary to a left paramidline ventral abdominal wall hernia. No specific evidence of complicating ischemia. 2. Motion degradation within the lower chest and upper abdomen. Electronically Signed   By: Jeronimo Greaves M.D.   On: 05/09/2018 21:03    Procedures Dr. Gaynelle Adu 05/09/18 - diagnostic laparoscopy, open primary repair of umbilical hernia  Hospital Course:  22 y/o otherwise healthy, spanish speakling female who presented to [facility] with increased pain around umbilical hernia.  Workup showed incarcerated umbilical hernia w/ resulting SBO. Patient was admitted and underwent procedure listed above.  Tolerated procedure well and was transferred to the floor.  Diet was advanced as tolerated.  On POD#1, the patient was voiding well, tolerating a liquid diet, ambulating well, pain controlled, vital signs stable, incisions c/d/i and felt stable for discharge home.  Patient will follow up in our office in 2-3 weeks and knows to call with questions or concerns.    Physical Exam: General:  Alert, NAD, pleasant, comfortable Abd:  Soft, ND, mild tenderness, incisions C/D/I  Allergies as of 05/10/2018   No Known Allergies     Medication List    TAKE these medications   acetaminophen 500 MG tablet Commonly known as:  TYLENOL Take 2 tablets (1,000 mg total) by mouth every 8 (eight) hours as needed.   traMADol 50 MG tablet Commonly known as:  ULTRAM Take 1 tablet (50  mg total) by mouth  every 6 (six) hours as needed (mild pain).        Follow-up Information    Gaynelle AduWilson, Eric, MD. Schedule an appointment as soon as possible for a visit.   Specialty:  General Surgery Why:  2-3 weeks for post-operative follow up Contact information: [address] [place] [address] [phone]          Signed: Hosie SpangleElizabeth Simaan, [facility] 05/10/2018, 10:29 AM Pager: [phone] Consults: [phone] Mon-Fri 7:00 am-4:30 pm Sat-Sun 7:00 am-11:30 am

## 2018-05-10 NOTE — Anesthesia Procedure Notes (Signed)
Procedure Name: Intubation Date/Time: 05/10/2018 12:59 AM Performed by: Edmonia CaprioAuston, Jezebel Pollet M, CRNA Pre-anesthesia Checklist: Patient identified, Emergency Drugs available, Suction available, Patient being monitored and Timeout performed Patient Re-evaluated:Patient Re-evaluated prior to induction Oxygen Delivery Method: Circle system utilized Preoxygenation: Pre-oxygenation with 100% oxygen Induction Type: IV induction and Rapid sequence Laryngoscope Size: Miller and 2 Grade View: Grade I Tube type: Oral Tube size: 7.0 mm Number of attempts: 1 Airway Equipment and Method: Stylet Placement Confirmation: ETT inserted through vocal cords under direct vision,  positive ETCO2 and breath sounds checked- equal and bilateral Secured at: 23 cm Tube secured with: Tape Dental Injury: Teeth and Oropharynx as per pre-operative assessment

## 2018-05-10 NOTE — Anesthesia Postprocedure Evaluation (Signed)
Anesthesia Post Note  Patient: Jamie CiproMaria Espinoza Hess  Procedure(s) Performed: LAPAROSCOPY DIAGNOSTIC (N/A Abdomen) OPEN HERNIA REPAIR UMBILICAL ADULT (N/A Abdomen)     Patient location during evaluation: PACU Anesthesia Type: General Level of consciousness: awake and alert Pain management: pain level controlled Vital Signs Assessment: post-procedure vital signs reviewed and stable Respiratory status: spontaneous breathing, nonlabored ventilation, respiratory function stable and patient connected to nasal cannula oxygen Cardiovascular status: blood pressure returned to baseline and stable Postop Assessment: no apparent nausea or vomiting Anesthetic complications: no    Last Vitals:  Vitals:   05/10/18 0230 05/10/18 0422  BP: 122/66 (!) 110/54  Pulse: 75 73  Resp: 16 16  Temp: 36.5 C 36.8 C  SpO2: 96% 97%    Last Pain:  Vitals:   05/10/18 0722  TempSrc:   PainSc: 4                  Ryan P Ellender

## 2018-05-10 NOTE — Transfer of Care (Signed)
Immediate Anesthesia Transfer of Care Note  Patient: Jamie CiproMaria Espinoza Hess  Procedure(s) Performed: LAPAROSCOPY DIAGNOSTIC (N/A Abdomen) OPEN HERNIA REPAIR UMBILICAL ADULT (N/A Abdomen)  Patient Location: PACU  Anesthesia Type:General  Level of Consciousness: awake, alert  and oriented  Airway & Oxygen Therapy: Patient Spontanous Breathing and Patient connected to nasal cannula oxygen  Post-op Assessment: Report given to RN and Post -op Vital signs reviewed and stable  Post vital signs: Reviewed and stable  Last Vitals:  Vitals Value Taken Time  BP 119/57 05/10/2018  2:05 AM  Temp    Pulse 103 05/10/2018  2:07 AM  Resp 24 05/10/2018  2:07 AM  SpO2 100 % 05/10/2018  2:07 AM  Vitals shown include unvalidated device data.  Last Pain:  Vitals:   05/09/18 2301  TempSrc:   PainSc: 5          Complications: No apparent anesthesia complications

## 2018-05-10 NOTE — Progress Notes (Signed)
Jamie CiproMaria Espinoza Hess to be D/C'd Home per MD order.  Discussed prescriptions and follow up appointments with the patient. Prescriptions given to patient, medication list explained in detail. Pt verbalized understanding.  Allergies as of 05/10/2018   No Known Allergies     Medication List    TAKE these medications   acetaminophen 500 MG tablet Commonly known as:  TYLENOL Take 2 tablets (1,000 mg total) by mouth every 8 (eight) hours as needed.   traMADol 50 MG tablet Commonly known as:  ULTRAM Take 1 tablet (50 mg total) by mouth every 6 (six) hours as needed (mild pain).       Vitals:   05/10/18 0230 05/10/18 0422  BP: 122/66 (!) 110/54  Pulse: 75 73  Resp: 16 16  Temp: 97.7 F (36.5 C) 98.3 F (36.8 C)  SpO2: 96% 97%    Skin clean, dry and intact without evidence of skin break down, no evidence of skin tears noted. Abdominal incisions, clean, dry, and intact with adhesive glue. IV catheter discontinued intact. Site without signs and symptoms of complications. Dressing and pressure applied. Pt denies pain at this time. No complaints noted.  An After Visit Summary was printed and given to the patient, 1 prescription for tramadol printed and given to patient. Patient escorted via WC, and D/C home via private auto.  GrenadaBrittany Soleil Mas RN

## 2018-05-11 ENCOUNTER — Encounter (HOSPITAL_COMMUNITY): Payer: Self-pay | Admitting: General Surgery

## 2018-10-28 NOTE — L&D Delivery Note (Signed)
[facility]N [facility] Delivery Note  Jamie Hess is a 23 y.o. [id number] s/p VBAC at [redacted]w[redacted]d. She was admitted for ?SROM and early labor.   ROM: 2h 23m with clear fluid GBS Status: Negative/-- (11/23 0000) Maximum Maternal Temperature: 98.1 F    Labor Progress: . Patient arrived at 2 cm dilation and was induced with foley bulb and pitocin. After foley bulb came out progressed to 7 cm and found to still be intact and had AROM for clear. Then progressed to complete.   Delivery Date/Time: 10/12/2019 at 0415 Delivery: Called to room and patient was complete and pushing. Head delivered in OA position. No nuchal cord present. Shoulder and body delivered in usual fashion. Infant with spontaneous cry, placed on mother's abdomen, dried and stimulated. Cord clamped x 2 after 1-minute delay, and cut by FOB. Cord blood drawn. Placenta delivered spontaneously with gentle cord traction. Fundus firm with massage and Pitocin. Labia, perineum, vagina, and cervix inspected with hemostatic R labial and hemostatic 1st degree perineal, neither repaired.   Placenta: 3v intact, to L&D Complications: none Lacerations: hemostatic R labial and hemostatic 1st degree perineal, neither repaired EBL: 100 cc Analgesia: epidural   Infant: APGAR (1 MIN): 9   APGAR (5 MINS): 9    Weight: 3306 grams  Augustin Coupe, MD/[facility] OB/[facility]N Fellow, Faculty Practice

## 2019-01-04 ENCOUNTER — Ambulatory Visit: Payer: Self-pay | Attending: Nurse Practitioner | Admitting: Nurse Practitioner

## 2019-01-04 ENCOUNTER — Encounter: Payer: Self-pay | Admitting: Nurse Practitioner

## 2019-01-04 ENCOUNTER — Ambulatory Visit: Payer: Self-pay

## 2019-01-04 VITALS — BP 119/72 | HR 74 | Temp 98.2°F | Ht 64.0 in | Wt 166.2 lb

## 2019-01-04 DIAGNOSIS — M5442 Lumbago with sciatica, left side: Secondary | ICD-10-CM

## 2019-01-04 MED ORDER — NAPROXEN 500 MG PO TABS: 500.0000 mg | ORAL_TABLET | Freq: Two times a day (BID) | ORAL | 0 refills | Status: AC

## 2019-01-04 MED ORDER — CYCLOBENZAPRINE HCL 5 MG PO TABS: 5.0000 mg | ORAL_TABLET | Freq: Three times a day (TID) | ORAL | 0 refills | Status: AC | PRN

## 2019-01-04 NOTE — Progress Notes (Signed)
Assessment & Plan:  Jamie Hess was seen today for pain.  Diagnoses and all orders for this visit:  Acute left-sided low back pain with left-sided sciatica -     naproxen (NAPROSYN) 500 MG tablet; Take 1 tablet (500 mg total) by mouth 2 (two) times daily with a meal for 30 days. -     cyclobenzaprine (FLEXERIL) 5 MG tablet; Take 1 tablet (5 mg total) by mouth 3 (three) times daily as needed for up to 14 days for muscle spasms. Work on losing weight to help reduce back pain. May alternate with heat and ice application for pain relief. May also alternate with acetaminophen as prescribed for back pain. Other alternatives include massage, acupuncture and water aerobics.  You must stay active and avoid a sedentary lifestyle.    Patient has been counseled on age-appropriate routine health concerns for screening and prevention. These are reviewed and up-to-date. Referrals have been placed accordingly. Immunizations are up-to-date or declined.    Subjective:   Chief Complaint  Patient presents with  . Pain    Pt. stated she have pain on the left side of her body and down to her feet.    HPI Jamie Hess 23 y.o. female presents to office today complaints of sciatica. VRI was used to communicate directly with patient for the entire encounter including providing detailed patient instructions.   Back Pain: Patient presents for presents evaluation of low back problems.  Symptoms have been present for 15 days and include complaining of a cramping pain that radiates from her left lower back down into her left leg.  She states there is cramping in her left buttock as well . Initial inciting event: none. Symptoms are worst: all day. Taking Tylenol with no relief of symptoms. Exacerbating factors identifiable by patient are bending forwards and heavy lifting. Previous lower back problems: mild low back pain. Previous workup: none. Previous treatments: none. She states the pain prevents her from picking up  her baby and affects her walking as well.      Review of Systems  Constitutional: Negative.  Negative for fever, malaise/fatigue and weight loss.  HENT: Negative.  Negative for nosebleeds.   Eyes: Negative.  Negative for blurred vision, double vision and photophobia.  Respiratory: Negative.  Negative for cough, shortness of breath and wheezing.   Cardiovascular: Negative.  Negative for chest pain, palpitations and leg swelling.  Gastrointestinal: Negative.  Negative for heartburn, nausea and vomiting.  Musculoskeletal: Positive for back pain. Negative for myalgias.  Neurological: Negative for dizziness, sensory change, speech change, focal weakness, seizures, weakness and headaches.       Sciatica  Psychiatric/Behavioral: Negative.  Negative for suicidal ideas.    Past Medical History:  Diagnosis Date  . 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 CESAREAN SECTION    . LAPAROSCOPY N/A 05/10/2018   Procedure: LAPAROSCOPY DIAGNOSTIC;  Surgeon: Gaynelle Adu, MD;  Location: [facility] OR;  Service: General;  Laterality: N/A;  . UMBILICAL HERNIA REPAIR N/A 05/10/2018   Procedure: OPEN HERNIA REPAIR UMBILICAL ADULT;  Surgeon: Gaynelle Adu, MD;  Location: [facility] OR;  Service: General;  Laterality: N/A;    Family History  Problem Relation Age of Onset  . Hearing loss Daughter   . Diabetes Mother   . Diabetes Paternal Grandmother   . Diabetes Paternal Grandfather  Social History Reviewed with no changes to be made today.   Outpatient Medications Prior to Visit  Medication Sig Dispense Refill  . acetaminophen (TYLENOL) 500 MG tablet Take 2 tablets (1,000 mg total) by mouth every 8 (eight) hours as needed. (Patient not taking: Reported on 01/04/2019) 30 tablet 0  . traMADol (ULTRAM) 50 MG tablet Take 1 tablet (50 mg  total) by mouth every 6 (six) hours as needed (mild pain). (Patient not taking: Reported on 01/04/2019) 15 tablet 0   No facility-administered medications prior to visit.     No Known Allergies     Objective:    BP 119/72 (BP Location: Left Arm, Patient Position: Sitting, Cuff Size: Normal)   Pulse 74   Temp 98.2 F (36.8 C) (Oral)   Ht 5\' 4"  (1.626 m)   Wt 166 lb 3.2 oz (75.4 kg)   LMP 12/26/2018   SpO2 98%   BMI 28.53 kg/m  Wt Readings from Last 3 Encounters:  01/04/19 166 lb 3.2 oz (75.4 kg)  05/09/18 158 lb (71.7 kg)  01/19/18 158 lb 3.2 oz (71.8 kg)    Physical Exam Vitals signs and nursing note reviewed.  Constitutional:      Appearance: She is well-developed.  HENT:     Head: Normocephalic and atraumatic.  Neck:     Musculoskeletal: Normal range of motion.  Cardiovascular:     Rate and Rhythm: Normal rate and regular rhythm.     Heart sounds: Normal heart sounds. No murmur. No friction rub. No gallop.   Pulmonary:     Effort: Pulmonary effort is normal. No tachypnea or respiratory distress.     Breath sounds: Normal breath sounds. No decreased breath sounds, wheezing, rhonchi or rales.  Chest:     Chest wall: No tenderness.  Abdominal:     General: Bowel sounds are normal.     Palpations: Abdomen is soft.  Musculoskeletal: Normal range of motion.     Left hip: She exhibits no bony tenderness, no swelling, no crepitus, no deformity and no laceration.     Comments: Patient reports moderate shooting/cramping pain with left knee and hip flexion and extension.  Pain is absent on the right. Strength is equal to bilateral lower extremities.  2+ patellar reflexes bilaterally.    Skin:    General: Skin is warm and dry.  Neurological:     Mental Status: She is alert and oriented to person, place, and time.     Motor: Motor function is intact.     Coordination: Coordination is intact. Coordination normal.     Gait: Gait is intact.     Deep Tendon Reflexes:     Reflex  Scores:      Patellar reflexes are 2+ on the right side and 2+ on the left side. Psychiatric:        Behavior: Behavior normal. Behavior is cooperative.        Thought Content: Thought content normal.        Judgment: Judgment normal.          Patient has been counseled extensively about nutrition and exercise as well as the importance of adherence with medications and regular follow-up. The patient was given clear instructions to go to ER or return to medical center if symptoms don't improve, worsen or new problems develop. The patient verbalized understanding.   Follow-up: No follow-ups on file.   Claiborne Rigg, FNP-BC [facility] and [facility] [place], [place] [phone]   01/04/2019,  9:53 AM

## 2019-01-04 NOTE — Progress Notes (Signed)
   Assessment & Plan:  There are no diagnoses linked to this encounter.  Patient has been counseled on age-appropriate routine health concerns for screening and prevention. These are reviewed and up-to-date. Referrals have been placed accordingly. Immunizations are up-to-date or declined.    Subjective:   Chief Complaint  Patient presents with  . Pain    Pt. stated she have pain on the left side of her body and down to her feet.    HPI Jamie Hess 23 y.o. female presents to office today   ROS  Past Medical History:  Diagnosis Date  . 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 CESAREAN SECTION    . LAPAROSCOPY N/A 05/10/2018   Procedure: LAPAROSCOPY DIAGNOSTIC;  Surgeon: Gaynelle Adu, MD;  Location: [facility] OR;  Service: General;  Laterality: N/A;  . UMBILICAL HERNIA REPAIR N/A 05/10/2018   Procedure: OPEN HERNIA REPAIR UMBILICAL ADULT;  Surgeon: Gaynelle Adu, MD;  Location: [facility] OR;  Service: General;  Laterality: N/A;    Family History  Problem Relation Age of Onset  . Hearing loss Daughter   . Diabetes Mother   . Diabetes Paternal Grandmother   . Diabetes Paternal Grandfather     Social History Reviewed with no changes to be made today.   Outpatient Medications Prior to Visit  Medication Sig Dispense Refill  . acetaminophen (TYLENOL) 500 MG tablet Take 2 tablets (1,000 mg total) by mouth every 8 (eight) hours as needed. (Patient not taking: Reported on 01/04/2019) 30 tablet 0  . traMADol (ULTRAM) 50 MG tablet Take 1 tablet (50 mg total) by mouth every 6 (six) hours as needed (mild pain). (Patient not taking: Reported on 01/04/2019) 15 tablet 0   No facility-administered medications prior to visit.     No Known Allergies     Objective:    BP 119/72 (BP Location: Left Arm, Patient  Position: Sitting, Cuff Size: Normal)   Pulse 74   Temp 98.2 F (36.8 C) (Oral)   Ht 5\' 4"  (1.626 m)   Wt 166 lb 3.2 oz (75.4 kg)   LMP 12/26/2018   SpO2 98%   BMI 28.53 kg/m  Wt Readings from Last 3 Encounters:  01/04/19 166 lb 3.2 oz (75.4 kg)  05/09/18 158 lb (71.7 kg)  01/19/18 158 lb 3.2 oz (71.8 kg)    Physical Exam       Patient has been counseled extensively about nutrition and exercise as well as the importance of adherence with medications and regular follow-up. The patient was given clear instructions to go to ER or return to medical center if symptoms don't improve, worsen or new problems develop. The patient verbalized understanding.   Follow-up: No follow-ups on file.   Claiborne Rigg, FNP-BC [facility] and [facility] [place], [place] [phone]   01/04/2019, 9:15 AM

## 2019-01-04 NOTE — Patient Instructions (Signed)
Ejercicios para la espalda Back Exercises Si tiene dolor de espalda, haga estos ejercicios 2 o 3veces por da, o como se lo haya indicado el mdico. Cuando el dolor desaparezca, hgalos una vez por da, pero haga ms repeticiones de cada ejercicio. Si no le duele la espalda, haga estos ejercicios una vez por da o como se lo haya indicado el mdico. Ejercicios Rodilla al pecho Repita estos pasos 3 o 5veces seguidas con cada pierna: 1. Acustese boca arriba sobre una cama dura o sobre el suelo con las piernas extendidas. 2. Lleve una rodilla al pecho. 3. Mantenga la rodilla contra el pecho. Para lograrlo tmese la rodilla o el muslo. 4. Tire de la rodilla hasta sentir una elongacin suave en la parte baja de la espalda. 5. Mantenga la elongacin durante 10 a 30segundos. 6. Suelte y extienda la pierna lentamente. Inclinacin de la pelvis Repita estos pasos 5 o 10veces seguidas: 1. Acustese boca arriba sobre una cama dura o sobre el suelo con las piernas extendidas. 2. Flexione las rodillas de manera que apunten al techo. Los pies deben estar apoyados en el suelo. 3. Contraiga los msculos de la parte baja del vientre (abdomen) para empujar la zona lumbar contra el suelo. Este movimiento har que el cccix apunte hacia el techo, en lugar de apuntar hacia abajo en direccin a los pies o al suelo. 4. Mantenga esta posicin durante 5 a 10segundos mientras contrae suavemente los msculos y respira con normalidad. El perro y el gato Repita estos pasos hasta que la zona lumbar se curve con ms facilidad: 1. Apoye las palmas de las manos y las rodillas sobre una superficie firme. Las manos deben estar alineadas con los hombros y las rodillas con las caderas. Puede colocarse almohadillas debajo de las rodillas. 2. Deje caer la cabeza y lleve el cccix hacia abajo de modo que apunte en direccin al suelo para que la zona lumbar se arquee como el lomo de un gato [place]. 3. Mantenga esta posicin  durante 5segundos. 4. Lentamente, levante la cabeza y lleve el cccix hacia arriba de modo que apunte en direccin al techo para que la espalda se arquee (hunda) como el lomo de un perro contento. 5. Mantenga esta posicin durante 5segundos.  Flexiones de brazos Repita estos pasos 5 o 10veces seguidas: 1. Acustese boca abajo en el suelo. 2. Ponga las manos cerca de la cabeza, separadas aproximadamente al ancho de los hombros. 3. Con la espalda relajada y las caderas apoyadas en el suelo, extienda lentamente los brazos para levantar la mitad superior del cuerpo y Optometrist los hombros. No use los msculos de la espalda. Para estar ms cmodo, puede cambiar la [employer]. 4. Mantenga esta posicin durante 5segundos. 5. Lentamente vuelva a la posicin horizontal.  Puentes Repita estos pasos 10veces seguidas: 1. Acustese boca arriba sobre una superficie firme. 2. Flexione las rodillas de manera que apunten al techo. Los pies deben estar apoyados en el suelo. 3. Contraiga los glteos y despegue las nalgas del suelo hasta que la cintura est casi a la altura de las rodillas. Si no siente el trabajo muscular en las nalgas y la parte posterior de los muslos, aleje los pies 1 o 2pulgadas (2,5 o 5centmetros) de las nalgas. 4. Mantenga esta posicin durante 3 a 5segundos. 5. Lentamente, vuelva a apoyar las nalgas en el suelo y relaje los glteos. Si este ejercicio le resulta muy fcil, intente realizarlo con los brazos cruzados [place]. Abdominales  Repita estos  pasos 5 o 10veces seguidas: 1. Acustese boca arriba sobre una cama dura o sobre el suelo con las piernas extendidas. 2. Flexione las rodillas de manera que apunten al techo. Los pies deben estar apoyados en el suelo. 3. Cruce los [employer]. 4. Baje levemente el mentn en direccin al pecho, pero no doble el cuello. 5. Contraiga los msculos del abdomen y con lentitud eleve el pecho lo suficiente como  para despegar levemente los omplatos del suelo. 6. Lentamente baje el pecho y la cabeza hasta el suelo. Elevaciones de espalda Repita estos pasos 5 o 10veces seguidas: 1. Acustese boca abajo con los brazos a los costados y apoye la frente en el suelo. 2. Contraiga los msculos de las piernas y los glteos. 3. Lentamente despegue el pecho del suelo mientras mantiene las caderas apoyadas en el suelo. Mantenga la nuca alineada con la curvatura de la espalda. Mire hacia el suelo mientras hace este ejercicio. 4. Mantenga esta posicin durante 3 a 5segundos. 5. Lentamente baje el pecho y el rostro hasta el suelo. Comunquese con un mdico si:  El dolor de espalda se vuelve mucho ms intenso cuando hace un ejercicio.  El dolor de espalda no se [place] 2horas despus de [employer] ejercicios. Si tiene alguno de [employer], deje de [employer] ejercicios. No vuelva a hacer los ejercicios a menos que el mdico lo autorice. Solicite ayuda de inmediato si:  Siente un dolor sbito y muy intenso en la espalda. Si esto ocurre, deje de [employer]. No vuelva a hacer los ejercicios a menos que el mdico lo autorice. Esta informacin no tiene Theme park manager el consejo del mdico. Asegrese de hacerle al mdico cualquier pregunta que tenga. Document Released: 01/29/2011 Document Revised: 07/11/2017 Document Reviewed: 12/08/2014 Elsevier Interactive Patient Education  2019 [employer].  Ejercicios para fortalecer los msculos del Educational psychologist Exercises  Los ejercicios para fortalecer los msculos del tronco ayudan a Environmental education officer los msculos que se encuentran entre las costillas y la cadera (msculos abdominales). Estos msculos ayudan a Manufacturing engineer cuerpo y Education officer, environmental columna vertebral. Es importante mantener el tronco fortalecido para Automotive engineer lesiones y Engineer, mining. Algunas actividades, como yoga y pilates, pueden ayudar a Chief Operating Officer los msculos del tronco. Tambin puede  fortalecer los msculos del tronco haciendo ejercicios en su casa. Es importante que hable con el mdico antes de comenzar una nueva rutina de actividad fsica. Cules son los beneficios de los ejercicios para fortalecer los msculos del tronco? Los ejercicios para fortalecer los msculos del tronco pueden hacer lo siguiente:  Teacher, early years/pre de espalda.  Ayudar a recobrar fuerza despus de una lesin en la espalda o en la columna vertebral.  Ayudar a prevenir lesiones durante la actividad fsica, especialmente lesiones en la espalda y las rodillas. Cmo hacer los ejercicios para fortalecer los msculos del tronco Repita estos ejercicios de 10 a 15 veces o hasta que se canse. Haga los ejercicios exactamente como se lo haya indicado el mdico y gradelos como se lo hayan indicado. Es normal sentir un estiramiento leve, tironeo, opresin o Dentist al [employer] ejercicios. Si siente dolor al [employer] ejercicios, detngase. Si el dolor contina o empeora al [employer] ejercicios para fortalecer los msculos del tronco, comunquese con el mdico. Le aconsejamos utilizar una colchoneta acolchada para los ejercicios que se realizan en el piso. Puente  1. Recustese boca arriba sobre una superficie firme con las rodillas flexionadas y los pies completamente apoyados en  el suelo. 2. Levante la cadera de modo que las rodillas, la cadera y los hombros formen una lnea recta. Mantenga los msculos abdominales contrados. 3. Mantenga esta posicin durante 3 a 5segundos. 4. Baje lentamente la cadera a la posicin inicial. 5. Relaje los msculos por completo entre repeticiones. Puente con una sola pierna 1. Recustese boca arriba sobre una superficie firme con las rodillas flexionadas y los pies completamente apoyados en el suelo. 2. Levante la cadera de modo que las rodillas, la cadera y los hombros formen una lnea recta. Mantenga los msculos abdominales contrados. 3. Levante un pie del  suelo, luego enderece completamente esa pierna. 4. Mantenga esta posicin durante 3 a 5segundos. 5. Baje la pierna enderezada y vuelva a flexionarla. 6. Baje lentamente la cadera a la posicin inicial. 7. Repita estos pasos con la otra pierna. Puente lateral 1. Recustese de costado con las rodillas flexionadas. Apyese en el codo que est cerca del suelo. 2. Usando sus msculos abdominales y el codo que est apoyado en el suelo, levante el cuerpo del suelo. Levante la cadera de [employer] hombros, la cadera y el pie formen una lnea recta. 3. Mantenga esta posicin durante 10segundos. Mantenga la cabeza y el cuello levantados y lejos del hombro (en su posicin normal, neutral). Mantenga los msculos abdominales contrados. 4. Baje lentamente la cadera a la posicin inicial. 5. Repita e intente mantener esta posicin por ms tiempo, hasta llegar a mantenerla por 30 segundos. Abdominales 1. Acustese boca arriba sobre una superficie firme. Doble las rodillas y [employer] pies planos sobre el piso. 2. Cruce los [employer]. 3. Sin doblar el cuello, baje levemente el mentn en direccin al pecho. 4. Contraiga los msculos abdominales mientras levanta el pecho lo suficiente como para Artist los omplatos del suelo. No contenga la respiracin. Puede hacer esto con elevaciones cortas o largas. 5. Vuelva lentamente a la posicin inicial. Superman en cuadrupedia o bird dog 1. Apyese [employer] y las rodillas, con las piernas paralelas a los hombros y los brazos debajo de los hombros. Mantenga la espalda recta. 2. Contraiga los msculos abdominales. 3. Levante una pierna del piso y endercela. Intente mantenerla paralela al piso. 4. Baje lentamente la pierna hasta la posicin inicial. 5. Levante un brazo del piso y endercelo. Intente mantenerlo paralelo al piso. 6. Baje lentamente el brazo hasta la posicin inicial. 7. Repita con el otro brazo y la otra pierna. Si es  posible, intente levantar una pierna y un brazo al [employer], en los lados opuestos del cuerpo. Por ejemplo, levante el brazo izquierdo y Hotel manager. [place] 1. Acustese boca abajo. 2. Apoye el cuerpo [employer] y los pies, manteniendo las piernas rectas. El cuerpo debe formar una lnea recta [employer] hombros y los pies. 3. Sostenga esta posicin durante 10 segundos mientras mantiene los msculos abdominales contrados. 4. Baje el cuerpo hasta la posicin inicial. 5. Repita e intente mantener esta posicin por ms tiempo, hasta llegar a mantenerla por 30 segundos. Fortalecimiento cruzado 1. Prese con los pies separados al ancho de los hombros. 2. Rosario Jacks pelota frente a usted. Mantenga los brazos extendidos. 3. Contraiga los msculos abdominales y gire lentamente la cintura de un lado a otro. Mantenga los pies planos. 4. Cuando est en una posicin cmoda, trate de repetir este ejercicio con una pelota ms pesada. Fortalecimiento de Hydrographic surveyor superior del tronco 1. Prese a una distancia aproximada de 18pulgadas (46cm) de la pared,  con la espalda contra la pared. 2. Mantenga los pies planos y paralelos a los hombros. 3. Contraiga los msculos abdominales. 4. Doble la cadera y las rodillas. 5. Lentamente, intente tocar [employer] pared que se encuentra detrs de usted. 6. Lentamente, vuelva a incorporarse. 7. [employer] brazos sobre la cabeza e intente tocar la pared que se encuentra detrs de usted. 8. Regrese a la posicin inicial. Consejos generales  No haga ningn ejercicio que le cause dolor. Si siente dolor mientras hace ejercicio, hable con el mdico.  Siempre elongue antes y despus de hacer estos ejercicios. Esto puede ayudar a Company secretary.  Mantenga un peso saludable. Pregunte a su mdico cul es el peso saludable para usted. Comunquese con un mdico si:  Tiene un dolor en la espalda que empeora o no desaparece.  Siente dolor al  hacer ejercicios para fortalecer los msculos del tronco. Solicite ayuda de inmediato si:  Tiene un dolor intenso que no mejora con medicamentos. Resumen  Los ejercicios para fortalecer los msculos del tronco ayudan a Chief Operating Officer los msculos que se encuentran entre las costillas y la cadera.  Los msculos del tronco ayudan a Manufacturing engineer cuerpo y Naval architect la columna vertebral.  Verner Chol actividades, como yoga y pilates, pueden ayudar a Chief Operating Officer los msculos del tronco.  [place] ejercicios para fortalecer los msculos del tronco pueden ayudar a Engineer, materials de espalda y a Agricultural engineer.  Si siente dolor al hacer ejercicios para fortalecer los msculos del tronco, [place]. Esta informacin no tiene Theme park manager el consejo del mdico. Asegrese de hacerle al mdico cualquier pregunta que tenga. Document Released: 04/22/2017 Document Revised: 04/22/2017 Document Reviewed: 04/22/2017 Elsevier Interactive Patient Education  2019 [employer].

## 2019-04-29 ENCOUNTER — Other Ambulatory Visit (HOSPITAL_COMMUNITY): Payer: Self-pay | Admitting: Obstetrics and Gynecology

## 2019-04-29 LAB — OB RESULTS CONSOLE HIV ANTIBODY (ROUTINE TESTING): HIV: NONREACTIVE

## 2019-04-29 LAB — OB RESULTS CONSOLE ABO/RH: RH Type: POSITIVE

## 2019-04-29 LAB — OB RESULTS CONSOLE RUBELLA ANTIBODY, IGM: Rubella: NON-IMMUNE/NOT IMMUNE

## 2019-04-29 LAB — OB RESULTS CONSOLE GC/CHLAMYDIA
Chlamydia: NEGATIVE
Gonorrhea: NEGATIVE

## 2019-04-29 LAB — OB RESULTS CONSOLE RPR: RPR: NONREACTIVE

## 2019-04-29 LAB — OB RESULTS CONSOLE HEPATITIS B SURFACE ANTIGEN: Hepatitis B Surface Ag: NEGATIVE

## 2019-05-04 ENCOUNTER — Other Ambulatory Visit (HOSPITAL_COMMUNITY): Payer: Self-pay | Admitting: Nurse Practitioner

## 2019-05-04 DIAGNOSIS — Z3689 Encounter for other specified antenatal screening: Secondary | ICD-10-CM

## 2019-05-04 DIAGNOSIS — Z3A23 23 weeks gestation of pregnancy: Secondary | ICD-10-CM

## 2019-05-04 DIAGNOSIS — Z8279 Family history of other congenital malformations, deformations and chromosomal abnormalities: Secondary | ICD-10-CM

## 2019-05-06 ENCOUNTER — Encounter (HOSPITAL_COMMUNITY): Payer: Self-pay | Admitting: *Deleted

## 2019-05-10 ENCOUNTER — Other Ambulatory Visit: Payer: Self-pay

## 2019-05-10 ENCOUNTER — Ambulatory Visit (HOSPITAL_COMMUNITY): Payer: Self-pay

## 2019-05-10 ENCOUNTER — Encounter (HOSPITAL_COMMUNITY): Payer: Self-pay

## 2019-05-10 ENCOUNTER — Ambulatory Visit (HOSPITAL_COMMUNITY): Payer: Self-pay | Admitting: Obstetrics and Gynecology

## 2019-05-10 ENCOUNTER — Ambulatory Visit (HOSPITAL_COMMUNITY)
Admission: RE | Admit: 2019-05-10 | Discharge: 2019-05-10 | Disposition: A | Discharge status: Home / Self Care | Payer: Self-pay | Source: Ambulatory Visit | Attending: Obstetrics and Gynecology | Admitting: Obstetrics and Gynecology

## 2019-05-10 DIAGNOSIS — Z1371 Encounter for nonprocreative screening for genetic disease carrier status: Secondary | ICD-10-CM

## 2019-05-10 NOTE — Progress Notes (Addendum)
Patient in session with Genetic Counselor, Control and instrumentation engineer. Inheritest Core drawn today.

## 2019-05-12 ENCOUNTER — Encounter (HOSPITAL_COMMUNITY): Payer: Self-pay | Admitting: *Deleted

## 2019-05-12 ENCOUNTER — Other Ambulatory Visit: Payer: Self-pay

## 2019-05-12 ENCOUNTER — Ambulatory Visit (HOSPITAL_COMMUNITY)
Admission: RE | Admit: 2019-05-12 | Discharge: 2019-05-12 | Disposition: A | Discharge status: Home / Self Care | Payer: Self-pay | Source: Ambulatory Visit | Attending: Nurse Practitioner | Admitting: Nurse Practitioner

## 2019-05-12 ENCOUNTER — Ambulatory Visit (HOSPITAL_COMMUNITY): Payer: Self-pay | Admitting: *Deleted

## 2019-05-12 ENCOUNTER — Other Ambulatory Visit (HOSPITAL_COMMUNITY): Payer: Self-pay | Admitting: *Deleted

## 2019-05-12 VITALS — BP 123/79 | Temp 98.7°F

## 2019-05-12 DIAGNOSIS — Z362 Encounter for other antenatal screening follow-up: Secondary | ICD-10-CM

## 2019-05-12 DIAGNOSIS — Z3689 Encounter for other specified antenatal screening: Secondary | ICD-10-CM | POA: Insufficient documentation

## 2019-05-12 DIAGNOSIS — O2690 Pregnancy related conditions, unspecified, unspecified trimester: Secondary | ICD-10-CM

## 2019-05-12 DIAGNOSIS — Z3687 Encounter for antenatal screening for uncertain dates: Secondary | ICD-10-CM

## 2019-05-12 DIAGNOSIS — Z3A17 17 weeks gestation of pregnancy: Secondary | ICD-10-CM

## 2019-05-12 DIAGNOSIS — Z8279 Family history of other congenital malformations, deformations and chromosomal abnormalities: Secondary | ICD-10-CM | POA: Insufficient documentation

## 2019-05-12 DIAGNOSIS — Z3A23 23 weeks gestation of pregnancy: Secondary | ICD-10-CM | POA: Insufficient documentation

## 2019-05-12 DIAGNOSIS — O34219 Maternal care for unspecified type scar from previous cesarean delivery: Secondary | ICD-10-CM

## 2019-05-12 NOTE — Progress Notes (Signed)
Jamie Hess present as interpreter. 

## 2019-05-27 LAB — INHERITEST CORE(CF97,SMA,FRAX)

## 2019-06-23 ENCOUNTER — Encounter (HOSPITAL_COMMUNITY): Payer: Self-pay

## 2019-06-23 ENCOUNTER — Ambulatory Visit (HOSPITAL_COMMUNITY): Payer: Self-pay | Attending: Maternal & Fetal Medicine

## 2019-06-23 ENCOUNTER — Ambulatory Visit (HOSPITAL_COMMUNITY): Admission: RE | Admit: 2019-06-23 | Payer: Self-pay | Source: Ambulatory Visit

## 2019-06-29 ENCOUNTER — Ambulatory Visit (HOSPITAL_COMMUNITY)
Admission: RE | Admit: 2019-06-29 | Discharge: 2019-06-29 | Disposition: A | Discharge status: Home / Self Care | Payer: Self-pay | Source: Ambulatory Visit | Attending: Obstetrics and Gynecology | Admitting: Obstetrics and Gynecology

## 2019-06-29 ENCOUNTER — Other Ambulatory Visit: Payer: Self-pay

## 2019-06-29 ENCOUNTER — Ambulatory Visit (HOSPITAL_COMMUNITY): Payer: Self-pay | Admitting: *Deleted

## 2019-06-29 VITALS — BP 119/72 | HR 75 | Temp 98.5°F

## 2019-06-29 DIAGNOSIS — Z362 Encounter for other antenatal screening follow-up: Secondary | ICD-10-CM | POA: Insufficient documentation

## 2019-06-29 DIAGNOSIS — Z3A24 24 weeks gestation of pregnancy: Secondary | ICD-10-CM

## 2019-06-29 DIAGNOSIS — O2692 Pregnancy related conditions, unspecified, second trimester: Secondary | ICD-10-CM

## 2019-06-29 DIAGNOSIS — O34219 Maternal care for unspecified type scar from previous cesarean delivery: Secondary | ICD-10-CM

## 2019-06-29 DIAGNOSIS — Z148 Genetic carrier of other disease: Secondary | ICD-10-CM | POA: Insufficient documentation

## 2019-09-20 LAB — OB RESULTS CONSOLE GBS: GBS: NEGATIVE

## 2019-10-11 ENCOUNTER — Inpatient Hospital Stay (HOSPITAL_COMMUNITY)
Admission: AD | Admit: 2019-10-11 | Discharge: 2019-10-13 | DRG: 807 | Disposition: A | Discharge status: Home / Self Care | Payer: Medicaid Other | Attending: Obstetrics and Gynecology | Admitting: Obstetrics and Gynecology

## 2019-10-11 ENCOUNTER — Inpatient Hospital Stay (HOSPITAL_COMMUNITY): Payer: Medicaid Other | Admitting: Anesthesiology

## 2019-10-11 ENCOUNTER — Other Ambulatory Visit: Payer: Self-pay

## 2019-10-11 ENCOUNTER — Encounter (HOSPITAL_COMMUNITY): Payer: Self-pay | Admitting: Obstetrics & Gynecology

## 2019-10-11 DIAGNOSIS — Z789 Other specified health status: Secondary | ICD-10-CM | POA: Diagnosis present

## 2019-10-11 DIAGNOSIS — O26893 Other specified pregnancy related conditions, third trimester: Secondary | ICD-10-CM | POA: Diagnosis present

## 2019-10-11 DIAGNOSIS — O34219 Maternal care for unspecified type scar from previous cesarean delivery: Secondary | ICD-10-CM | POA: Diagnosis present

## 2019-10-11 DIAGNOSIS — Z3A39 39 weeks gestation of pregnancy: Secondary | ICD-10-CM

## 2019-10-11 DIAGNOSIS — Z20828 Contact with and (suspected) exposure to other viral communicable diseases: Secondary | ICD-10-CM | POA: Diagnosis present

## 2019-10-11 DIAGNOSIS — Z98891 History of uterine scar from previous surgery: Secondary | ICD-10-CM

## 2019-10-11 DIAGNOSIS — Z87441 Personal history of nephrotic syndrome: Secondary | ICD-10-CM | POA: Diagnosis not present

## 2019-10-11 DIAGNOSIS — O4290 Premature rupture of membranes, unspecified as to length of time between rupture and onset of labor, unspecified weeks of gestation: Secondary | ICD-10-CM

## 2019-10-11 LAB — CBC
HCT: 40.6 % (ref 36.0–46.0)
Hemoglobin: 13.6 g/dL (ref 12.0–15.0)
MCH: 29.5 pg (ref 26.0–34.0)
MCHC: 33.5 g/dL (ref 30.0–36.0)
MCV: 88.1 fL (ref 80.0–100.0)
Platelets: 183 10*3/uL (ref 150–400)
RBC: 4.61 MIL/uL (ref 3.87–5.11)
RDW: 15.3 % (ref 11.5–15.5)
WBC: 11.4 10*3/uL — ABNORMAL HIGH (ref 4.0–10.5)
nRBC: 0 % (ref 0.0–0.2)

## 2019-10-11 LAB — SARS CORONAVIRUS 2 (TAT 6-24 HRS): SARS Coronavirus 2: NEGATIVE

## 2019-10-11 LAB — TYPE AND SCREEN
ABO/RH(D): O POS
Antibody Screen: NEGATIVE

## 2019-10-11 LAB — POCT FERN TEST: POCT Fern Test: POSITIVE

## 2019-10-11 LAB — ABO/RH: ABO/RH(D): O POS

## 2019-10-11 LAB — AMNISURE RUPTURE OF MEMBRANE (ROM) NOT AT ARMC: Amnisure ROM: NEGATIVE

## 2019-10-11 MED ORDER — TERBUTALINE SULFATE 1 MG/ML IJ SOLN: 0.2500 mg | Freq: Once | INTRAMUSCULAR | Status: DC | PRN

## 2019-10-11 MED ORDER — PHENYLEPHRINE 40 MCG/ML (10ML) SYRINGE FOR IV PUSH (FOR BLOOD PRESSURE SUPPORT)
80.0000 ug | PREFILLED_SYRINGE | INTRAVENOUS | Status: DC | PRN
  Filled 2019-10-11: qty 10

## 2019-10-11 MED ORDER — LACTATED RINGERS IV SOLN: 500.0000 mL | INTRAVENOUS | Status: DC | PRN

## 2019-10-11 MED ORDER — OXYTOCIN 40 UNITS IN NORMAL SALINE INFUSION - SIMPLE MED
2.5000 [IU]/h | INTRAVENOUS | Status: DC
  Administered 2019-10-12: 2.5 [IU]/h via INTRAVENOUS
  Filled 2019-10-11: qty 1000

## 2019-10-11 MED ORDER — LIDOCAINE-EPINEPHRINE (PF) 2 %-1:200000 IJ SOLN
INTRAMUSCULAR | Status: DC | PRN
  Administered 2019-10-11 (×2): 2 mL via EPIDURAL

## 2019-10-11 MED ORDER — OXYCODONE-ACETAMINOPHEN 5-325 MG PO TABS: 1.0000 | ORAL_TABLET | ORAL | Status: DC | PRN

## 2019-10-11 MED ORDER — SODIUM CHLORIDE (PF) 0.9 % IJ SOLN
INTRAMUSCULAR | Status: DC | PRN
  Administered 2019-10-11: 12 mL/h via EPIDURAL

## 2019-10-11 MED ORDER — OXYTOCIN 40 UNITS IN NORMAL SALINE INFUSION - SIMPLE MED
1.0000 m[IU]/min | INTRAVENOUS | Status: DC
  Administered 2019-10-11: 2 m[IU]/min via INTRAVENOUS

## 2019-10-11 MED ORDER — LIDOCAINE HCL (PF) 1 % IJ SOLN: 30.0000 mL | INTRAMUSCULAR | Status: DC | PRN

## 2019-10-11 MED ORDER — ACETAMINOPHEN 325 MG PO TABS: 650.0000 mg | ORAL_TABLET | ORAL | Status: DC | PRN

## 2019-10-11 MED ORDER — FENTANYL-BUPIVACAINE-NACL 0.5-0.125-0.9 MG/250ML-% EP SOLN
12.0000 mL/h | EPIDURAL | Status: DC | PRN
  Filled 2019-10-11: qty 250

## 2019-10-11 MED ORDER — OXYCODONE-ACETAMINOPHEN 5-325 MG PO TABS: 2.0000 | ORAL_TABLET | ORAL | Status: DC | PRN

## 2019-10-11 MED ORDER — DIPHENHYDRAMINE HCL 50 MG/ML IJ SOLN: 12.5000 mg | INTRAMUSCULAR | Status: DC | PRN

## 2019-10-11 MED ORDER — OXYTOCIN BOLUS FROM INFUSION
500.0000 mL | Freq: Once | INTRAVENOUS | Status: AC
  Administered 2019-10-12: 500 mL via INTRAVENOUS

## 2019-10-11 MED ORDER — ONDANSETRON HCL 4 MG/2ML IJ SOLN: 4.0000 mg | Freq: Four times a day (QID) | INTRAMUSCULAR | Status: DC | PRN

## 2019-10-11 MED ORDER — PHENYLEPHRINE 40 MCG/ML (10ML) SYRINGE FOR IV PUSH (FOR BLOOD PRESSURE SUPPORT): 80.0000 ug | PREFILLED_SYRINGE | INTRAVENOUS | Status: DC | PRN

## 2019-10-11 MED ORDER — LACTATED RINGERS IV SOLN
INTRAVENOUS | Status: DC
  Administered 2019-10-11 (×2): via INTRAVENOUS

## 2019-10-11 MED ORDER — SOD CITRATE-CITRIC ACID 500-334 MG/5ML PO SOLN: 30.0000 mL | ORAL | Status: DC | PRN

## 2019-10-11 MED ORDER — EPHEDRINE 5 MG/ML INJ: 10.0000 mg | INTRAVENOUS | Status: DC | PRN

## 2019-10-11 MED ORDER — LACTATED RINGERS IV SOLN: 500.0000 mL | Freq: Once | INTRAVENOUS | Status: DC

## 2019-10-11 NOTE — MAU Provider Note (Signed)
None    *Spanish interpreter at bedside for this exam*   S: Ms. Jamie Hess is a 23 y.o. [phone] at [redacted]w[redacted]d  who presents to MAU today complaining of leaking of fluid since 0500 this morning. She denies vaginal bleeding. She denies contractions. She reports normal fetal movement.    O: BP 117/69   Pulse 70   Temp 97.7 F (36.5 C) (Oral)   Resp 18   LMP 12/01/2018   SpO2 100%  GENERAL: Well-developed, well-nourished female in no acute distress.  HEAD: Normocephalic, atraumatic.  CHEST: Normal effort of breathing, regular heart rate ABDOMEN: Soft, nontender, gravid PELVIC: Normal external female genitalia. Vagina is pink and rugated. Cervix with normal contour, no lesions. Normal discharge.  Small amount of clear fluid pooling.   Cervical exam: deferred   Fetal Monitoring: Baseline: 135 Variability: moderate Accelerations: 15x15 Decelerations: none Contractions: Q2-6 mins  Results for orders placed or performed during the hospital encounter of 10/11/19 (from the past 24 hour(s))  Amnisure rupture of membrane (rom)not at [facility]     Status: None   Collection Time: 10/11/19 12:41 PM  Result Value Ref Range   Amnisure ROM NEGATIVE   Fern Test     Status: Abnormal   Collection Time: 10/11/19  2:16 PM  Result Value Ref Range   POCT Fern Test Positive = ruptured amniotic membanes    MDM Pt had a spec exam & positive fern at [facility] today RN in MAU reports negative fern here Amnisure negative.  Discussed with patient that since she had a positive exam in the office this morning, I could repeat her exam to be sure. She is agreeable with this plan. SSE performed and small amount of clear fluid pooling. Fern sample from this exam is positive. Will admit for SROM.   Pt informed that the ultrasound is considered a limited OB ultrasound and is not intended to be a complete ultrasound exam.  Patient also informed that the ultrasound is not being completed with the intent of assessing  for fetal or placental anomalies or any pelvic abnormalities.  Explained that the purpose of today's ultrasound is to assess for  presentation.  Patient acknowledges the purpose of the exam and the limitations of the study.   Cephalic    A: [facility] at [redacted]w[redacted]d  SROM  P: Admit to birthing suites  GBS negative Vertex per [facility] Labor team notified   Jorje Guild, NP 10/11/2019 2:31 PM

## 2019-10-11 NOTE — Anesthesia Procedure Notes (Signed)

## 2019-10-11 NOTE — MAU Note (Signed)
Pt presents to MAU from [facility] dept. Due to leaking of fluid. They did a FERN test that was positive. Pt has felt leaking since 5 am today. Pt has felt mild ctx today. Pt denies VB. +FM

## 2019-10-11 NOTE — Anesthesia Preprocedure Evaluation (Addendum)
Anesthesia Evaluation  Patient identified by MRN, date of birth, ID band Patient awake    Reviewed: Allergy & Precautions, NPO status , Patient's Chart, lab work & pertinent test results  Airway Mallampati: II  TM Distance: >3 FB Neck ROM: Full    Dental no notable dental hx.    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  Reproductive/Obstetrics (+) Pregnancy                            Anesthesia Physical Anesthesia Plan  ASA: II  Anesthesia Plan: Epidural   Post-op Pain Management:    Induction:   PONV Risk Score and Plan: Treatment may vary due to age or medical condition  Airway Management Planned: Natural Airway  Additional Equipment:   Intra-op Plan:   Post-operative Plan:   Informed Consent: I have reviewed the patients History and Physical, chart, labs and discussed the procedure including the risks, benefits and alternatives for the proposed anesthesia with the patient or authorized representative who has indicated his/her understanding and acceptance.       Plan Discussed with: Anesthesiologist  Anesthesia Plan Comments: (Patient identified. Risks, benefits, options discussed with patient including but not limited to bleeding, infection, nerve damage, paralysis, failed block, incomplete pain control,  headache, blood pressure changes, nausea, vomiting, reactions to medication, itching, and post partum back pain. Confirmed with bedside nurse the patient's most recent platelet count. Confirmed with the patient that they are not taking any anticoagulation, have any bleeding history or any family history of bleeding disorders. Patient expressed understanding and wishes to proceed. All questions were answered. )        Anesthesia Quick Evaluation

## 2019-10-11 NOTE — H&P (Addendum)
OBSTETRIC ADMISSION HISTORY AND PHYSICAL  Jamie Hess is a 23 y.o. female [phone] with IUP at [redacted]w[redacted]d presenting for SROM. She reports +FMs. No LOF, VB, blurry vision, headaches, peripheral edema, or RUQ pain. She plans on bottle and breastfeeding. She requests BTL if cesarean section or IUD following VBAC for birth control.  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 CESAREAN SECTION    . LAPAROSCOPY N/A 05/10/2018   Procedure: LAPAROSCOPY DIAGNOSTIC;  Surgeon: Greer Pickerel, MD;  Location: [facility];  Service: General;  Laterality: N/A;  . UMBILICAL HERNIA REPAIR N/A 05/10/2018   Procedure: OPEN HERNIA REPAIR UMBILICAL ADULT;  Surgeon: Greer Pickerel, MD;  Location: [facility] [facility];  Service: General;  Laterality: N/A;    Obstetrical History: OB History    Gravida  3   Para  2   Term  2   Preterm      AB      Living  2     SAB      TAB      Ectopic      Multiple  0   Live Births  1        Obstetric Comments  G1: 08/2011 c-section ? Abruption, 37wks, 3kg        Social History: Social History   Socioeconomic History  . Marital status: Single    Spouse name: Not on file  . Number of children: Not on file  . Years of education: Not on file  . Highest education level: Not on file  Occupational History  . Not on file  Tobacco Use  . Smoking status: Never  Smoker  . Smokeless tobacco: Never Used  Substance and Sexual Activity  . Alcohol use: No  . Drug use: No  . Sexual activity: Yes    Birth control/protection: None  Other Topics Concern  . Not on file  Social History Narrative  . Not on file   Social Determinants of Health   Financial Resource Strain:   . Difficulty of Paying Living Expenses: Not on file  Food Insecurity:   . Worried About Charity fundraiser in the Last Year: Not on file  . Ran Out of Food in the Last Year: Not on file  Transportation Needs:   . Lack of Transportation (Medical): Not on file  . Lack of Transportation (Non-Medical): Not on file  Physical Activity:   . Days of Exercise per Week: Not on file  . Minutes of Exercise per Session: Not on file  Stress:   . Feeling of Stress : Not on file  Social Connections:   . Frequency of Communication with Friends and Family: Not on file  . Frequency of Social Gatherings with Friends and Family: Not on file  . Attends Religious Services: Not on file  . Active Member of Clubs or Organizations: Not on file  . Attends Archivist Meetings: Not on  file  . Marital Status: Not on file    Family History: Family History  Problem Relation Age of Onset  . Hearing loss Daughter   . Diabetes Mother   . Diabetes Paternal Grandmother   . Diabetes Paternal Grandfather     Allergies: No Known Allergies  Medications Prior to Admission  Medication Sig Dispense Refill Last Dose  . clotrimazole (LOTRIMIN) 1 % cream Apply 1 application topically 2 (two) times daily.     . Prenatal Vit-Fe Fumarate-FA (PRENATAL VITAMIN PO) Take by mouth.        Review of Systems   All systems reviewed and negative except as stated in HPI  Blood pressure 117/69, pulse 70, temperature 97.7 F (36.5 C), temperature source Oral, resp. rate 18, last menstrual period 12/01/2018, SpO2 100 %, unknown if currently breastfeeding. General appearance: alert, cooperative and no  distress Lungs: regular rate and effort Heart: regular rate  Abdomen: soft, non-tender Extremities: Homans sign is negative, no sign of DVT Presentation: cephalic Fetal monitoringBaseline: 130 bpm, Variability: Good {> 6 bpm), Accelerations: Reactive and Decelerations: Absent Uterine activityFrequency: Every 2-4 minutes, Duration: 70-80 seconds and Intensity: mild    Prenatal labs: ABO, Rh: O+ Antibody: Negative Rubella:  Non-immune RPR:   Non-immune HBsAg:   NR HIV:   NR GBS:   Negative 1 hr GTT: 101  Prenatal Transfer Tool  Maternal Diabetes: No Genetic Screening: Normal Maternal Ultrasounds/Referrals: Normal Fetal Ultrasounds or other Referrals:  None Maternal Substance Abuse:  No Significant Maternal Medications:  None Significant Maternal Lab Results: Group B Strep negative  Results for orders placed or performed during the hospital encounter of 10/11/19 (from the past 24 hour(s))  Amnisure rupture of membrane (rom)not at [facility]   Collection Time: 10/11/19 12:41 PM  Result Value Ref Range   Amnisure ROM NEGATIVE   Fern Test   Collection Time: 10/11/19  2:16 PM  Result Value Ref Range   POCT Fern Test Positive = ruptured amniotic membanes   CBC   Collection Time: 10/11/19  4:59 PM  Result Value Ref Range   WBC 11.4 (H) 4.0 - 10.5 K/uL   RBC 4.61 3.87 - 5.11 MIL/uL   Hemoglobin 13.6 12.0 - 15.0 g/dL   HCT 37.1 69.6 - 78.9 %   MCV 88.1 80.0 - 100.0 fL   MCH 29.5 26.0 - 34.0 pg   MCHC 33.5 30.0 - 36.0 g/dL   RDW 38.1 01.7 - 51.0 %   Platelets 183 150 - 400 K/uL   nRBC 0.0 0.0 - 0.2 %  Type and screen [facility]   Collection Time: 10/11/19  4:59 PM  Result Value Ref Range   ABO/RH(D) PENDING    Antibody Screen PENDING    Sample Expiration      10/14/2019,2359 Performed at [facility], [address] [place], [place] [address]     Patient Active Problem List   Diagnosis Date Noted  . Indication for care in labor or delivery  10/11/2019  . Incarcerated umbilical hernia 05/10/2018  . VBAC (vaginal birth after Cesarean) 07/17/2017  . SVD (spontaneous vaginal delivery) 07/15/2017  . PROM (premature rupture of membranes) 07/14/2017  . History of cesarean delivery 04/15/2017  . Language barrier 04/15/2017  . 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 Family history of deafness and hearing loss 02/25/2017    Assessment/Plan: Shavy Beachem is a 23 y.o. G3P2002 at [redacted]w[redacted]d here for SROM  1. Labor: Admit to L&D - Foley  bulb placed and plan for low-dose Pitocin augmentation per protocol 2. FWB: Category I FHT 3. Pain: Anesthesia/Analgesia PRN 4. GBS: Negative 5. Postpartum contraception: Patient desires BTL if cesarean section and IUD if vaginal delivery 6. TOLAC, previous VBAC, Anticipate another VBAC  Lynnell GrainBrandy N Norton, Student-MidWife  10/11/2019, 5:16 PM  Midwife attestation: I have seen and examined this patient; I agree with above documentation in the student's note.   PE: Gen: calm comfortable, NAD Resp: normal effort and rate Abd: gravid EFW by Leopolds 8' ROS, labs, PMH reviewed  Assessment/Plan: Jamie Hess is a 23 y.o. [phone] [id number] here for PROM at term Admit to LD Labor: latent FWB: Cat I GBS neg Consented for TOLAC, anticipate VBAC  Donette LarryMelanie Jadrien Narine, CNM  10/11/2019, 6:59 PM

## 2019-10-12 ENCOUNTER — Encounter (HOSPITAL_COMMUNITY): Payer: Self-pay | Admitting: Obstetrics and Gynecology

## 2019-10-12 DIAGNOSIS — Z3A39 39 weeks gestation of pregnancy: Secondary | ICD-10-CM

## 2019-10-12 DIAGNOSIS — O34219 Maternal care for unspecified type scar from previous cesarean delivery: Secondary | ICD-10-CM

## 2019-10-12 LAB — RPR: RPR Ser Ql: NONREACTIVE

## 2019-10-12 MED ORDER — PRENATAL MULTIVITAMIN CH
1.0000 | ORAL_TABLET | Freq: Every day | ORAL | Status: DC
  Administered 2019-10-12 – 2019-10-13 (×2): 1 via ORAL
  Filled 2019-10-12 (×2): qty 1

## 2019-10-12 MED ORDER — OXYCODONE HCL 5 MG PO TABS: 5.0000 mg | ORAL_TABLET | ORAL | Status: DC | PRN

## 2019-10-12 MED ORDER — MAGNESIUM HYDROXIDE 400 MG/5ML PO SUSP: 30.0000 mL | ORAL | Status: DC | PRN

## 2019-10-12 MED ORDER — ONDANSETRON HCL 4 MG PO TABS: 4.0000 mg | ORAL_TABLET | ORAL | Status: DC | PRN

## 2019-10-12 MED ORDER — DIBUCAINE (PERIANAL) 1 % EX OINT: 1.0000 "application " | TOPICAL_OINTMENT | CUTANEOUS | Status: DC | PRN

## 2019-10-12 MED ORDER — OXYCODONE HCL 5 MG PO TABS: 10.0000 mg | ORAL_TABLET | ORAL | Status: DC | PRN

## 2019-10-12 MED ORDER — IBUPROFEN 600 MG PO TABS
600.0000 mg | ORAL_TABLET | Freq: Four times a day (QID) | ORAL | Status: DC
  Administered 2019-10-12 – 2019-10-13 (×6): 600 mg via ORAL
  Filled 2019-10-12 (×6): qty 1

## 2019-10-12 MED ORDER — SENNOSIDES-DOCUSATE SODIUM 8.6-50 MG PO TABS
2.0000 | ORAL_TABLET | ORAL | Status: DC
  Administered 2019-10-12: 2 via ORAL
  Filled 2019-10-12: qty 2

## 2019-10-12 MED ORDER — BENZOCAINE-MENTHOL 20-0.5 % EX AERO
1.0000 "application " | INHALATION_SPRAY | CUTANEOUS | Status: DC | PRN
  Administered 2019-10-12: 1 via TOPICAL
  Filled 2019-10-12 (×2): qty 56

## 2019-10-12 MED ORDER — ONDANSETRON HCL 4 MG/2ML IJ SOLN: 4.0000 mg | INTRAMUSCULAR | Status: DC | PRN

## 2019-10-12 MED ORDER — COCONUT OIL OIL: 1.0000 "application " | TOPICAL_OIL | Status: DC | PRN

## 2019-10-12 MED ORDER — DIPHENHYDRAMINE HCL 25 MG PO CAPS: 25.0000 mg | ORAL_CAPSULE | Freq: Four times a day (QID) | ORAL | Status: DC | PRN

## 2019-10-12 MED ORDER — ACETAMINOPHEN 325 MG PO TABS: 650.0000 mg | ORAL_TABLET | ORAL | Status: DC | PRN

## 2019-10-12 MED ORDER — SIMETHICONE 80 MG PO CHEW: 80.0000 mg | CHEWABLE_TABLET | ORAL | Status: DC | PRN

## 2019-10-12 MED ORDER — WITCH HAZEL-GLYCERIN EX PADS: 1.0000 "application " | MEDICATED_PAD | CUTANEOUS | Status: DC | PRN

## 2019-10-12 NOTE — Progress Notes (Signed)
LABOR PROGRESS NOTE  Jamie Hess is a 23 y.o. [id number] at [redacted]w[redacted]d  admitted for ?SROM.  Subjective: Feeling pain in LLQ, worse w contractions  Objective: BP (!) 155/75   Pulse 85   Temp 97.9 F (36.6 C) (Oral)   Resp 18   Ht 5\' 3"  (1.6 m) Comment: from prenatal record  Wt 84.8 kg   LMP 12/01/2018   SpO2 99%   BMI 33.13 kg/m  or  Vitals:   10/12/19 0200 10/12/19 0230 10/12/19 0300 10/12/19 0325  BP: 126/78 137/72 (!) 148/94 (!) 155/75  Pulse: 88 80 76 85  Resp: 20 20 18    Temp:      TempSrc:      SpO2:      Weight:      Height:         Dilation: Lip/rim Effacement (%): 100 Station: Plus 2 Presentation: Vertex Exam by:: Joshua Zeringue  FHT: baseline rate 125, moderate varibility, +acel, early and variable decel Toco: q2-3 min  Labs: Lab Results  Component Value Date   WBC 11.4 (H) 10/11/2019   HGB 13.6 10/11/2019   HCT 40.6 10/11/2019   MCV 88.1 10/11/2019   PLT 183 10/11/2019    Patient Active Problem List   Diagnosis Date Noted  . Indication for care in labor or delivery 10/11/2019  . Incarcerated umbilical hernia 25/00/3704  . VBAC (vaginal birth after Cesarean) 07/17/2017  . SVD (spontaneous vaginal delivery) 07/15/2017  . PROM (premature rupture of membranes) 07/14/2017  . History of cesarean delivery 04/15/2017  . Language barrier 04/15/2017  . 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 Family history of deafness and hearing loss 02/25/2017    Assessment / Plan: 23 y.o. G3P2002 at [redacted]w[redacted]d here for ?SROM.  Labor: s/p FB, pitocin, and AROM and progressed to fully dilated. Started to push with patient however complained of significant LLQ pain worse with pushing. At that time FHT had minimal variability, no significant bleeding or loss of station. Discussed w Dr. Rip Harbour, suspect likely hot spot, less likely sign of uterine rupture, plan to ask anesthesia to dose up epidural and labor down, resume pushing once patient more comfortable. Since that time  tracing has improved with moderate variability and accels. Fetal Wellbeing:  Cat II for variables but now overall Pain Control:  epidural GBS: neg Anticipated MOD:  SVD  TOLAC: consent signed 09/10/2019  Augustin Coupe, MD/MPH OB Fellow  10/12/2019, 3:34 AM

## 2019-10-12 NOTE — Anesthesia Postprocedure Evaluation (Signed)
Anesthesia Post Note  Patient: Jamie Hess  Procedure(s) Performed: AN AD Groom     Patient location during evaluation: Mother Baby Anesthesia Type: Epidural Level of consciousness: awake and alert Pain management: pain level controlled Vital Signs Assessment: post-procedure vital signs reviewed and stable Respiratory status: spontaneous breathing, nonlabored ventilation and respiratory function stable Cardiovascular status: stable Postop Assessment: no headache, no backache and epidural receding Anesthetic complications: no    Last Vitals:  Vitals:   10/12/19 0826 10/12/19 1228  BP: 111/81 111/68  Pulse: 78 81  Resp: 19 18  Temp: 36.7 C 36.8 C  SpO2:      Last Pain:  Vitals:   10/12/19 1228  TempSrc: Oral  PainSc: 0-No pain   Pain Goal:                   Katrina Brosh L Shanaia Sievers

## 2019-10-12 NOTE — Lactation Note (Signed)
This note was copied from a baby's chart. Lactation Consultation Note  Patient Name: Jamie Hess [id number] Date: 10/12/2019 Reason for consult: Initial assessment  Upon entering the room, the mom was about to give the baby a bottle of formula. After [facility] student set up the [employer] interpreter, Jamie Hess ID [phone], [place] asked that the mom wait before she gave the bottle so baby could be seen at the breast.   Mom obliged and allowed Jamie Hess and Jamie Hess student to give her the some basic breastfeeding education before getting the baby latched at the breast. Jamie Hess student told mom about 8-12 feeds in 24 hours, cluster feeding, and watching for feeding cues. Jamie Hess student asked mom about how her breastfeeding experience has been going currently and mom stated well with no pain. Mom stated she had breastfed her 1st child for a little while and her 2nd for 7 months.  [employer] went out so another interpreter, Jamie Hess ID [id number], was used for the remainder of the visit. Mom was positioned better in the bed and Jamie Hess student taught hand expression on her right breast which she felt comfortable with. Jamie Hess student mentioned that colostrum was coming out and that the more she hand expressed the more milk will she will produce. Jamie Hess and Jamie Hess student told mom that formula is not needed because milk is coming in and the more stimulation the more milk she will see. After postioning her upright with pillow support the baby was brought to the right breast in a football hold. Baby did not seem interested in the breast and seemed to still be asleep.  Baby was rotated to the left breast in cross cradle to wake her up a bit more but still seemed to be asleep. After a few moments of breast compressions the baby was given a massage to arouse her awake which seemed to work. LC adjusted the baby in a cross cradle to the right breast to get a better view and noticed the baby was still uninterested in feeding. Baby was placed STS with mom and Jamie Hess  student explained the importance of STS.  Hand pump assembly, use, and cleaning was explained. Jamie Hess student told mom she should use hand pump after infant feeds at breast to stimulate her supply. Mom confirmed understanding of pump use. Mom was told that the infant's stomach size is small and that she may not pump a lot of milk right away but the more her baby grows and empties the breast the more milk she will produce.  Jamie Hess student also explained the in-patient and outpatient resources on the pamphlet that mom could use for support.  Maternal Data Has patient been taught Hand Expression?: Yes Does the patient have breastfeeding experience prior to this delivery?: (attempt to breastfeed first and then 7 months with second)  Feeding Feeding Type: Breast Fed  LATCH Score Latch: Grasps breast easily, tongue down, lips flanged, rhythmical sucking.  Audible Swallowing: A few with stimulation  Type of Nipple: Everted at rest and after stimulation  Comfort (Breast/Nipple): Soft / non-tender  Hold (Positioning): Assistance needed to correctly position infant at breast and maintain latch.  LATCH Score: 8  Interventions    Lactation Tools Discussed/Used Tools: Pump Breast pump type: Manual WIC Program: Yes Pump Review: Setup, frequency, and cleaning;Milk Storage   Consult Status      Jamie Hess 10/12/2019, 12:50 PM

## 2019-10-12 NOTE — Discharge Summary (Signed)
Postpartum Discharge Summary     Patient Name: Jamie Hess DOB: [birth year] MRN: [id number]  Date of admission: 10/11/2019 Delivering Provider: Clarnce Flock   Date of discharge: 10/13/2019  Admitting diagnosis: Indication for care in labor or delivery [O75.9] Intrauterine pregnancy: [redacted]w[redacted]d    Secondary diagnosis:  Active Problems:   History of cesarean delivery   Language barrier   VBAC (vaginal birth after Cesarean)   Indication for care in labor or delivery  Additional problems: None     Discharge diagnosis: Term Pregnancy Delivered and VBAC                                                                                                Post partum procedures:None  Augmentation: AROM, Pitocin and Foley Balloon  Complications: None  Hospital course:  Onset of Labor With Vaginal Delivery     23y.o. yo [id number] [id number] admitted in Latent Labor on 10/11/2019. Patient had an uncomplicated labor course as follows: Patient arrived at 2 cm dilation and was induced with foley bulb and pitocin. After foley bulb came out progressed to 7 cm and found to still be intact and had AROM for clear. Then progressed to complete.  Membrane Rupture Time/Date: 1:27 AM ,10/12/2019   Intrapartum Procedures: Episiotomy: None [1]                                         Lacerations:  1st degree [2];Perineal [11]  Patient had a delivery of a Viable infant. 10/12/2019  Information for the patient's newborn:  EsJessilynn TaftGirl MaArleene0[[id number]]Delivery Method: Vag-Spont(filed from delivery)     Pateint had an uncomplicated postpartum course.  She is ambulating, tolerating a regular diet, passing flatus, and urinating well. Desired IUD and to schedule outpatient appt for placement. Patient is discharged home in stable condition on 10/13/19.  Delivery time: 4:15 AM    Magnesium Sulfate received: No BMZ received: No Rhophylac:N/A MMR:Yes; ordered to be given prior to  DC Transfusion:No  Physical exam  Vitals:   10/12/19 0826 10/12/19 1228 10/12/19 1551 10/12/19 2039  BP: 111/81 111/68 107/67 96/71  Pulse: 78 81 73 73  Resp: _0 Temp: 98 F (36.7 C) 98.2 F (36.8 C) 98 F (36.7 C) 98.2 F (36.8 C)  TempSrc: Oral Oral Oral Oral  SpO2:    100%  Weight:      Height:       General: alert, cooperative and no distress Lochia: appropriate Uterine Fundus: firm DVT Evaluation: No evidence of DVT seen on physical exam. Labs: Lab Results  Component Value Date   WBC 11.4 (H) 10/11/2019   HGB 13.6 10/11/2019   HCT 40.6 10/11/2019   MCV 88.1 10/11/2019   PLT 183 10/11/2019   CMP Latest Ref Rng & Units 05/09/2018  Glucose 70 - 99 mg/dL 106(H)  BUN 6 - 20 mg/dL 12  Creatinine 0.44 - 1.00 mg/dL 0.96  Sodium 135 - 145 mmol/L 140  Potassium 3.5 - 5.1 mmol/L 3.9  Chloride 98 - 111 mmol/L 105  CO2 22 - 32 mmol/L 24  Calcium 8.9 - 10.3 mg/dL 9.2  Total Protein 6.5 - 8.1 g/dL 7.3  Total Bilirubin 0.3 - 1.2 mg/dL 0.5  Alkaline Phos 38 - 126 U/L 89  AST 15 - 41 U/L 15  ALT 0 - 44 U/L 14    Discharge instruction: per After Visit Summary and "Baby and Me Booklet".  After visit meds:  Allergies as of 10/13/2019   No Known Allergies     Medication List    STOP taking these medications   clotrimazole 1 % cream Commonly known as: LOTRIMIN     TAKE these medications   acetaminophen 325 MG tablet Commonly known as: Tylenol Take 2 tablets (650 mg total) by mouth every 6 (six) hours as needed (for pain scale < 4).   ibuprofen 600 MG tablet Commonly known as: ADVIL Take 1 tablet (600 mg total) by mouth every 6 (six) hours as needed.   PRENATAL VITAMIN PO Take by mouth.       Diet: routine diet  Activity: Advance as tolerated. Pelvic rest for 6 weeks.   Outpatient follow up:6 weeks Follow up Appt:No future appointments. Follow up Visit:   Please schedule this patient for Postpartum visit in: 6 weeks with the following  provider: Any provider For C/S patients schedule nurse incision check in weeks 2 weeks: no Low risk pregnancy complicated by: history of cesarean Delivery mode:  VBAC Anticipated Birth Control:  IUD PP Procedures needed: IUD insertion  Schedule Integrated [place] visit: no      Newborn Data: Live born female  Birth Weight: 3360g APGAR: 57, 9  Newborn Delivery   Birth date/time: 10/12/2019 04:15:00 Delivery type: Vaginal, Spontaneous      Baby Feeding: Bottle and Breast Disposition:home with mother   10/13/2019 Chauncey Mann, MD

## 2019-10-12 NOTE — Progress Notes (Signed)
LABOR PROGRESS NOTE  Jamie Hess is a 23 y.o. [id number] at [redacted]w[redacted]d  admitted for ?SROM.  Subjective: Comfortable w epidural  Objective: BP 129/67   Pulse 78   Temp 97.9 F (36.6 C) (Oral)   Resp 20   Ht 5\' 3"  (1.6 m) Comment: from prenatal record  Wt 84.8 kg   LMP 12/01/2018   SpO2 99%   BMI 33.13 kg/m  or  Vitals:   10/12/19 0000 10/12/19 0030 10/12/19 0100 10/12/19 0117  BP: 130/72 120/80 129/67   Pulse: 75 75 78   Resp: 20 20 20    Temp:    97.9 F (36.6 C)  TempSrc:    Oral  SpO2:      Weight:      Height:         Dilation: 7 Effacement (%): 100 Station: -1 Presentation: Vertex Exam by:: Dr Dione Plover  FHT: baseline rate 130, moderate varibility, +acel, early decel Toco: q2-3 min  Labs: Lab Results  Component Value Date   WBC 11.4 (H) 10/11/2019   HGB 13.6 10/11/2019   HCT 40.6 10/11/2019   MCV 88.1 10/11/2019   PLT 183 10/11/2019    Patient Active Problem List   Diagnosis Date Noted  . Indication for care in labor or delivery 10/11/2019  . Incarcerated umbilical hernia 25/85/2778  . VBAC (vaginal birth after Cesarean) 07/17/2017  . SVD (spontaneous vaginal delivery) 07/15/2017  . PROM (premature rupture of membranes) 07/14/2017  . History of cesarean delivery 04/15/2017  . Language barrier 04/15/2017  . 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 Family history of deafness and hearing loss 02/25/2017    Assessment / Plan: 23 y.o. G3P2002 at [redacted]w[redacted]d here for ?SROM.  Labor: on my exam now intact bag felt, AROM for clear, has progressed at this point to 7cm. Now with water broken anticipate quick progression. Fetal Wellbeing:  Cat I Pain Control:  epidural GBS: neg Anticipated MOD:  SVD  TOLAC: consent signed 09/10/2019  Augustin Coupe, MD/MPH OB Fellow  10/12/2019, 1:34 AM

## 2019-10-13 MED ORDER — IBUPROFEN 600 MG PO TABS: 600.0000 mg | ORAL_TABLET | Freq: Four times a day (QID) | ORAL | 0 refills | Status: DC | PRN

## 2019-10-13 MED ORDER — ACETAMINOPHEN 325 MG PO TABS: 650.0000 mg | ORAL_TABLET | Freq: Four times a day (QID) | ORAL | 0 refills | Status: DC | PRN

## 2019-10-13 NOTE — Progress Notes (Signed)
Post Partum Day 1 Subjective: Patient reports feeling well. She is tolerating PO. Ambulating and urinating without difficulty. Lochia minimal. She would like to DC today if baby can.   Objective: Blood pressure 96/71, pulse 73, temperature 98.2 F (36.8 C), temperature source Oral, resp. rate 20, height 5\' 3"  (1.6 m), weight 84.8 kg, last menstrual period 12/01/2018, SpO2 100 %, unknown if currently breastfeeding.  Physical Exam:  General: alert, cooperative, appears stated age and no distress Lochia: appropriate Uterine Fundus: firm DVT Evaluation: No evidence of DVT seen on physical exam.  Recent Labs    10/11/19 1659  HGB 13.6  HCT 40.6    Assessment/Plan: Plan for DC today if baby can; otherwise tomorrow IUD to be placed at HD in 4-6 weeks; patient to schedule own appt Vitals stable Breast and bottle feeding    LOS: 2 days   Chauncey Mann 10/13/2019, 4:37 AM

## 2020-04-12 ENCOUNTER — Other Ambulatory Visit: Payer: Self-pay

## 2020-04-12 ENCOUNTER — Ambulatory Visit: Payer: Self-pay | Attending: Nurse Practitioner | Admitting: Physician Assistant

## 2020-04-12 DIAGNOSIS — L600 Ingrowing nail: Secondary | ICD-10-CM

## 2020-04-12 DIAGNOSIS — Z789 Other specified health status: Secondary | ICD-10-CM

## 2020-04-12 DIAGNOSIS — K0389 Other specified diseases of hard tissues of teeth: Secondary | ICD-10-CM

## 2020-04-12 MED ORDER — MUPIROCIN 2 % EX OINT: TOPICAL_OINTMENT | CUTANEOUS | 0 refills | Status: DC

## 2020-04-12 MED ORDER — FLUCONAZOLE 150 MG PO TABS: 150.0000 mg | ORAL_TABLET | Freq: Once | ORAL | 0 refills | Status: AC

## 2020-04-12 MED ORDER — CEPHALEXIN 500 MG PO CAPS: 500.0000 mg | ORAL_CAPSULE | Freq: Three times a day (TID) | ORAL | 0 refills | Status: DC

## 2020-04-12 NOTE — Progress Notes (Signed)
Virtual Visit via Telephone Note  I connected with [name] on 04/12/20 at  2:50 PM EDT by telephone and verified that I am speaking with the correct person using two identifiers.   I discussed the limitations, risks, security and privacy concerns of performing an evaluation and management service by telephone and the availability of in person appointments. I also discussed with the patient that there may be a patient responsible charge related to this service. The patient expressed understanding and agreed to proceed.  PATIENT visit by telephone virtually in the context of Covid-19 pandemic. Patient location:  home My Location:  [facility] office Persons on the call:  Me, the patient, and interpreter    History of Present Illness:  1 week h/o infection L great toenail.  + pain and some purulent drainage.  Nursing 68 month old infant  Also needs dental referral for teeth sensitivity to hot and cold   Observations/Objective: NAD.  A&Ox3   Assessment and Plan: 1. Ingrowing toenail with infection Go to UC or ED if does not rapidly improve.  Salt water soaks tid - mupirocin ointment (BACTROBAN) 2 %; Apply bid to AA  Dispense: 22 g; Refill: 0 - fluconazole (DIFLUCAN) 150 MG tablet; Take 1 tablet (150 mg total) by mouth once for 1 dose. If needed for yeast infection after antibiotics  Dispense: 1 tablet; Refill: 0 - cephALEXin (KEFLEX) 500 MG capsule; Take 1 capsule (500 mg total) by mouth 3 (three) times daily.  Dispense: 30 capsule; Refill: 0  2. Language barrier pacific interpreters used and additional time performing visit was required.   3. Tooth sensitivity - Ambulatory referral to Dentistry  Follow Up Instructions: prn   I discussed the assessment and treatment plan with the patient. The patient was provided an opportunity to ask questions and all were answered. The patient agreed with the plan and demonstrated an understanding of the instructions.   The patient was  advised to call back or seek an in-person evaluation if the symptoms worsen or if the condition fails to improve as anticipated.  I provided 13 minutes of non-face-to-face time during this encounter.   Georgian Co, PA-C  Patient ID: Jamie Hess, female   DOB: November 02, 1995, 24 y.o.   MRN: [id number]

## 2021-05-11 ENCOUNTER — Other Ambulatory Visit: Payer: Self-pay | Admitting: Family Medicine

## 2021-05-11 DIAGNOSIS — Z363 Encounter for antenatal screening for malformations: Secondary | ICD-10-CM

## 2021-05-14 ENCOUNTER — Encounter: Payer: Self-pay | Admitting: *Deleted

## 2021-05-17 ENCOUNTER — Ambulatory Visit: Payer: Self-pay | Attending: Obstetrics and Gynecology

## 2021-05-17 ENCOUNTER — Other Ambulatory Visit: Payer: Self-pay | Admitting: Obstetrics & Gynecology

## 2021-05-17 ENCOUNTER — Ambulatory Visit: Payer: Self-pay | Admitting: *Deleted

## 2021-05-17 ENCOUNTER — Other Ambulatory Visit: Payer: Self-pay

## 2021-05-17 ENCOUNTER — Other Ambulatory Visit: Payer: Self-pay | Admitting: *Deleted

## 2021-05-17 VITALS — BP 122/62 | HR 83

## 2021-05-17 DIAGNOSIS — O34219 Maternal care for unspecified type scar from previous cesarean delivery: Secondary | ICD-10-CM

## 2021-05-17 DIAGNOSIS — Z3A17 17 weeks gestation of pregnancy: Secondary | ICD-10-CM

## 2021-05-17 DIAGNOSIS — Z148 Genetic carrier of other disease: Secondary | ICD-10-CM

## 2021-05-17 DIAGNOSIS — O321XX0 Maternal care for breech presentation, not applicable or unspecified: Secondary | ICD-10-CM

## 2021-05-17 DIAGNOSIS — Z363 Encounter for antenatal screening for malformations: Secondary | ICD-10-CM

## 2021-05-17 DIAGNOSIS — O359XX0 Maternal care for (suspected) fetal abnormality and damage, unspecified, not applicable or unspecified: Secondary | ICD-10-CM

## 2021-05-18 ENCOUNTER — Other Ambulatory Visit: Payer: Self-pay | Admitting: Family Medicine

## 2021-06-14 ENCOUNTER — Ambulatory Visit: Payer: Self-pay

## 2021-06-14 ENCOUNTER — Other Ambulatory Visit: Payer: Self-pay

## 2021-06-14 ENCOUNTER — Encounter: Payer: Self-pay | Admitting: *Deleted

## 2021-06-14 ENCOUNTER — Ambulatory Visit: Payer: Self-pay | Attending: Maternal & Fetal Medicine | Admitting: *Deleted

## 2021-06-14 ENCOUNTER — Ambulatory Visit (HOSPITAL_BASED_OUTPATIENT_CLINIC_OR_DEPARTMENT_OTHER): Payer: Self-pay

## 2021-06-14 VITALS — BP 108/65 | HR 65

## 2021-06-14 DIAGNOSIS — Z3A21 21 weeks gestation of pregnancy: Secondary | ICD-10-CM

## 2021-06-14 DIAGNOSIS — O359XX0 Maternal care for (suspected) fetal abnormality and damage, unspecified, not applicable or unspecified: Secondary | ICD-10-CM | POA: Insufficient documentation

## 2021-06-14 DIAGNOSIS — Z148 Genetic carrier of other disease: Secondary | ICD-10-CM | POA: Insufficient documentation

## 2021-06-14 DIAGNOSIS — Z362 Encounter for other antenatal screening follow-up: Secondary | ICD-10-CM

## 2021-06-14 DIAGNOSIS — O281 Abnormal biochemical finding on antenatal screening of mother: Secondary | ICD-10-CM

## 2021-06-14 DIAGNOSIS — O34219 Maternal care for unspecified type scar from previous cesarean delivery: Secondary | ICD-10-CM

## 2021-06-14 DIAGNOSIS — Z822 Family history of deafness and hearing loss: Secondary | ICD-10-CM

## 2021-06-14 DIAGNOSIS — O321XX0 Maternal care for breech presentation, not applicable or unspecified: Secondary | ICD-10-CM

## 2021-06-14 DIAGNOSIS — O26892 Other specified pregnancy related conditions, second trimester: Secondary | ICD-10-CM | POA: Insufficient documentation

## 2021-06-14 NOTE — Progress Notes (Signed)
Referring Provider:  Claiborne Rigg Length of Consultation: 40 minutes   Jamie Hess was referred to [facility] for genetic counseling and targeted ultrasound because of an increased risk for Down syndrome by the maternal serum prenatal screen.  This note summarizes the information we discussed with the aid of a Spanish interpreter by video call.  The patient was present at the visit alone.  We first provided background information on the maternal serum prenatal screen.  It was explained that this is a maternal blood test performed between the 14th and 21st week of pregnancy which measures several pregnancy proteins.  The levels of these proteins can help determine if a pregnancy is at high risk for certain birth defects or problems.  However, it cannot diagnose or rule out these defects.  An abnormal maternal serum screen does not necessarily mean that the baby has a problem.  Maternal serum screening can identify approximately 80% of neural tube defects, up to 75% of Down syndrome cases and 60% of trisomy 18 cases.  The neural tube consists of the fetal head and spine and if this structure fails to close during development, spina bifida (open spine) or anencephaly (open skull) could result.  Chromosomes are the inherited structures that contain our instructions for development (genes).  Each cell in our body normally has 46 chromosomes.  Rarely, when an egg and sperm unite, an extra or missing chromosome can be passed on to the baby by mistake.  These types of chromosome problems typically cause mental retardation and might also cause birth defects.  We discussed Down syndrome (an extra chromosome #21) and trisomy 49 (an extra chromosome #18).    The maternal serum screen revealed protein levels that increased the chance of Down syndrome (Trisomy 21) in the pregnancy.  Before maternal serum screening, the age-related chance of Down syndrome in the pregnancy was 1 in 920.  Given  the maternal serum screen results, the chance is now estimated to be 1 in 262.  This means that the chance the baby does not have Down sydnrome is greater than 99 percent.  The following prenatal testing options for this pregnancy:  Targeted ultrasound uses high frequency sound waves to create an image of the developing fetus.  An ultrasound is often recommended as a routine means of evaluating the pregnancy.  It is also used to screen for fetal anatomy problems (for example, a heart defect) that might be suggestive of a chromosomal or other abnormality.  The ultrasound at the time of this visit did not reveal any anomalies suggestive of a chromosome condition, but it is important to remember that not all babies with Down syndrome have feature on ultrasound.  Amniocentesis involves the removal of a small amount of amniotic fluid from the sac surrounding the fetus with the use of a thin needle inserted through the maternal abdomen and uterus.  Ultrasound guidance is used throughout the procedure.  Fetal cells are directly evaluated and > 98% of neural tube defects can be detected.  The main risks to this procedure include complications leading to miscarriage in less than 1 in 200 cases (0.5%).    We also reviewed the availability of cell free fetal DNA testing from maternal blood to determine whether or not the baby may have Down syndrome, trisomy 66, or trisomy 29.  This test utilizes a maternal blood sample and DNA sequencing technology to isolate circulating cell free fetal DNA from maternal plasma.  The fetal DNA can  then be analyzed for DNA sequences that are derived from the three most common chromosomes involved in aneuploidy, chromosomes 13, 18, and 21.  If the overall amount of DNA is greater than the expected level for any of these chromosomes, aneuploidy is suspected.  While we do not consider it a replacement for invasive testing and karyotype analysis, a negative result from this testing would be  reassuring, though not a guarantee of a normal chromosome complement for the baby.  An abnormal result is certainly suggestive of an abnormal chromosome complement, though we would still recommend CVS or amniocentesis to confirm any findings from this testing.  We obtained a detailed family history and pregnancy history.  This is the fourth pregnancy for Ms. Espinoza. She and her partner have three daughters, ages 40, 70, and 2 years.  The older two children have been diagnosed with Usher syndrome, type 10F.  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  Both girls have deafness and are being evaluated for possible vision impairment.  They are followed at [facility].  The patient is not interested in prenatal diagnosis for this condition and plans to follow up after delivery.  Of note, she also reported that there is question of the paternity in this pregnancy.  She is aware that if this baby has a different father, then the risk for Usher syndrome would be greatly reduced if he is not a carrier. If the father of her other children is the father, then the risk is 1 in 4 given the recessive inheritance.  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  Both are recessive and the other is not a carrier for these conditions, which greatly reduces the chance to have a child with either of these conditions.  See the prior genetic counseling notes for additional details.  Carrier testing on the other possible father is available but was declined. Lastly, the father of her other children has a family history of autism in his sister's son and daughter.  No additional details are known to provide further risk assessment for  this pregnancy.  There may be inherited as well as multifactorial causes for autism. We are happy to review records if desired.  The remainder of the family history was documented in her previous genetic counseling visit and is unremarkable for birth defects, developmental delays, recurrent pregnancy loss or known chromosome abnormalities.  Ms. Lindsi Bayliss reported no complications or exposures in this pregnancy that are expected to increase the risk for birth defects.   The patient was encouraged to call with questions or concerns. We can be contacted at [phone].  Plan of care: Ms. Melba Coon declined NIPS and amniocentesis to further assess for Down syndrome in this pregnancy. She was given information regarding non-invasive paternity testing.  Carrier screening for Usher syndrome could be offered to the possible FOB if desired. Her prior partner/possible father is known to be a carrier. The patient declines prenatal testing for Usher syndrome in this pregnancy and will follow up after delivery.  Cherly Anderson, MS, CGC

## 2021-06-15 ENCOUNTER — Other Ambulatory Visit: Payer: Self-pay | Admitting: *Deleted

## 2021-06-15 DIAGNOSIS — O34219 Maternal care for unspecified type scar from previous cesarean delivery: Secondary | ICD-10-CM

## 2021-08-02 ENCOUNTER — Ambulatory Visit: Payer: Self-pay | Admitting: *Deleted

## 2021-08-02 ENCOUNTER — Other Ambulatory Visit: Payer: Self-pay | Admitting: *Deleted

## 2021-08-02 ENCOUNTER — Other Ambulatory Visit: Payer: Self-pay

## 2021-08-02 ENCOUNTER — Encounter: Payer: Self-pay | Admitting: *Deleted

## 2021-08-02 ENCOUNTER — Ambulatory Visit: Payer: Self-pay | Attending: Maternal & Fetal Medicine

## 2021-08-02 VITALS — BP 137/69 | HR 84

## 2021-08-02 DIAGNOSIS — O34219 Maternal care for unspecified type scar from previous cesarean delivery: Secondary | ICD-10-CM | POA: Insufficient documentation

## 2021-08-02 DIAGNOSIS — O358XX0 Maternal care for other (suspected) fetal abnormality and damage, not applicable or unspecified: Secondary | ICD-10-CM

## 2021-08-02 DIAGNOSIS — O280 Abnormal hematological finding on antenatal screening of mother: Secondary | ICD-10-CM

## 2021-08-02 DIAGNOSIS — Z3A28 28 weeks gestation of pregnancy: Secondary | ICD-10-CM

## 2021-08-16 ENCOUNTER — Inpatient Hospital Stay (HOSPITAL_COMMUNITY)
Admission: EM | Admit: 2021-08-16 | Discharge: 2021-08-17 | DRG: 832 | Disposition: A | Discharge status: Home / Self Care | Payer: Medicaid Other | Attending: Pulmonary Disease | Admitting: Pulmonary Disease

## 2021-08-16 ENCOUNTER — Other Ambulatory Visit: Payer: Self-pay

## 2021-08-16 ENCOUNTER — Emergency Department (HOSPITAL_COMMUNITY): Payer: Medicaid Other

## 2021-08-16 ENCOUNTER — Encounter (HOSPITAL_COMMUNITY): Payer: Self-pay | Admitting: Emergency Medicine

## 2021-08-16 DIAGNOSIS — O99513 Diseases of the respiratory system complicating pregnancy, third trimester: Principal | ICD-10-CM | POA: Diagnosis present

## 2021-08-16 DIAGNOSIS — Z3A31 31 weeks gestation of pregnancy: Secondary | ICD-10-CM

## 2021-08-16 DIAGNOSIS — K122 Cellulitis and abscess of mouth: Secondary | ICD-10-CM | POA: Diagnosis present

## 2021-08-16 DIAGNOSIS — Z79899 Other long term (current) drug therapy: Secondary | ICD-10-CM

## 2021-08-16 DIAGNOSIS — R070 Pain in throat: Secondary | ICD-10-CM | POA: Diagnosis present

## 2021-08-16 DIAGNOSIS — T783XXA Angioneurotic edema, initial encounter: Secondary | ICD-10-CM | POA: Diagnosis present

## 2021-08-16 DIAGNOSIS — J040 Acute laryngitis: Secondary | ICD-10-CM

## 2021-08-16 DIAGNOSIS — O9A213 Injury, poisoning and certain other consequences of external causes complicating pregnancy, third trimester: Secondary | ICD-10-CM | POA: Diagnosis present

## 2021-08-16 DIAGNOSIS — Z20822 Contact with and (suspected) exposure to covid-19: Secondary | ICD-10-CM | POA: Diagnosis present

## 2021-08-16 DIAGNOSIS — T7840XA Allergy, unspecified, initial encounter: Secondary | ICD-10-CM

## 2021-08-16 DIAGNOSIS — J0510 Acute epiglottitis without obstruction: Secondary | ICD-10-CM | POA: Diagnosis present

## 2021-08-16 LAB — I-STAT CHEM 8, ED
BUN: <3 mg/dL — ABNORMAL LOW (ref 6–20)
Calcium, Ion: 1.23 mmol/L (ref 1.15–1.40)
Chloride: 106 mmol/L (ref 98–111)
Creatinine, Ser: 0.3 mg/dL — ABNORMAL LOW (ref 0.44–1.00)
Glucose, Bld: 105 mg/dL — ABNORMAL HIGH (ref 70–99)
HCT: 38 % (ref 36.0–46.0)
Hemoglobin: 12.9 g/dL (ref 12.0–15.0)
Potassium: 3.5 mmol/L (ref 3.5–5.1)
Sodium: 140 mmol/L (ref 135–145)
TCO2: 23 mmol/L (ref 22–32)

## 2021-08-16 LAB — COMPREHENSIVE METABOLIC PANEL
ALT: 14 U/L (ref 0–44)
ALT: 13 U/L (ref 0–44)
AST: 13 U/L — ABNORMAL LOW (ref 15–41)
AST: 12 U/L — ABNORMAL LOW (ref 15–41)
Albumin: 3 g/dL — ABNORMAL LOW (ref 3.5–5.0)
Albumin: 3 g/dL — ABNORMAL LOW (ref 3.5–5.0)
Alkaline Phosphatase: 72 U/L (ref 38–126)
Alkaline Phosphatase: 72 U/L (ref 38–126)
Anion gap: 7 (ref 5–15)
Anion gap: 10 (ref 5–15)
BUN: <5 mg/dL — ABNORMAL LOW (ref 6–20)
BUN: <5 mg/dL — ABNORMAL LOW (ref 6–20)
CO2: 22 mmol/L (ref 22–32)
CO2: 21 mmol/L — ABNORMAL LOW (ref 22–32)
Calcium: 8.8 mg/dL — ABNORMAL LOW (ref 8.9–10.3)
Calcium: 8.9 mg/dL (ref 8.9–10.3)
Chloride: 110 mmol/L (ref 98–111)
Chloride: 109 mmol/L (ref 98–111)
Creatinine, Ser: 0.44 mg/dL (ref 0.44–1.00)
Creatinine, Ser: 0.49 mg/dL (ref 0.44–1.00)
GFR, Estimated: >60 mL/min (ref >60)
GFR, Estimated: >60 mL/min (ref >60)
Glucose, Bld: 121 mg/dL — ABNORMAL HIGH (ref 70–99)
Glucose, Bld: 106 mg/dL — ABNORMAL HIGH (ref 70–99)
Potassium: 4 mmol/L (ref 3.5–5.1)
Potassium: 3.9 mmol/L (ref 3.5–5.1)
Sodium: 139 mmol/L (ref 135–145)
Sodium: 140 mmol/L (ref 135–145)
Total Bilirubin: 0.2 mg/dL — ABNORMAL LOW (ref 0.3–1.2)
Total Bilirubin: 0.8 mg/dL (ref 0.3–1.2)
Total Protein: 6.1 g/dL — ABNORMAL LOW (ref 6.5–8.1)
Total Protein: 6.2 g/dL — ABNORMAL LOW (ref 6.5–8.1)

## 2021-08-16 LAB — CBC WITH DIFFERENTIAL/PLATELET
Abs Immature Granulocytes: 0.12 10*3/uL — ABNORMAL HIGH (ref 0.00–0.07)
Basophils Absolute: 0 10*3/uL (ref 0.0–0.1)
Basophils Relative: 0 %
Eosinophils Absolute: 0.3 10*3/uL (ref 0.0–0.5)
Eosinophils Relative: 3 %
HCT: 39.1 % (ref 36.0–46.0)
Hemoglobin: 12.9 g/dL (ref 12.0–15.0)
Immature Granulocytes: 1 %
Lymphocytes Relative: 19 %
Lymphs Abs: 1.8 10*3/uL (ref 0.7–4.0)
MCH: 29.5 pg (ref 26.0–34.0)
MCHC: 33 g/dL (ref 30.0–36.0)
MCV: 89.5 fL (ref 80.0–100.0)
Monocytes Absolute: 0.5 10*3/uL (ref 0.1–1.0)
Monocytes Relative: 5 %
Neutro Abs: 7 10*3/uL (ref 1.7–7.7)
Neutrophils Relative %: 72 %
Platelets: 180 10*3/uL (ref 150–400)
RBC: 4.37 MIL/uL (ref 3.87–5.11)
RDW: 14.6 % (ref 11.5–15.5)
WBC: 9.8 10*3/uL (ref 4.0–10.5)
nRBC: 0 % (ref 0.0–0.2)

## 2021-08-16 LAB — PROTEIN / CREATININE RATIO, URINE
Creatinine, Urine: 49.45 mg/dL
Protein Creatinine Ratio: 0.2 mg/mg{Cre} — ABNORMAL HIGH (ref 0.00–0.15)
Total Protein, Urine: 10 mg/dL

## 2021-08-16 LAB — GLUCOSE, CAPILLARY
Glucose-Capillary: 113 mg/dL — ABNORMAL HIGH (ref 70–99)
Glucose-Capillary: 79 mg/dL (ref 70–99)

## 2021-08-16 LAB — SEDIMENTATION RATE: Sed Rate: 25 mm/hr — ABNORMAL HIGH (ref 0–22)

## 2021-08-16 LAB — RESP PANEL BY RT-PCR (FLU A&B, COVID) ARPGX2
Influenza A by PCR: NEGATIVE
Influenza B by PCR: NEGATIVE
SARS Coronavirus 2 by RT PCR: NEGATIVE

## 2021-08-16 LAB — HIV ANTIBODY (ROUTINE TESTING W REFLEX): HIV Screen 4th Generation wRfx: NONREACTIVE

## 2021-08-16 LAB — MRSA NEXT GEN BY PCR, NASAL: MRSA by PCR Next Gen: NOT DETECTED

## 2021-08-16 LAB — GROUP A STREP BY PCR: Group A Strep by PCR: NOT DETECTED

## 2021-08-16 MED ORDER — POLYETHYLENE GLYCOL 3350 17 G PO PACK: 17.0000 g | PACK | Freq: Every day | ORAL | Status: DC | PRN

## 2021-08-16 MED ORDER — DEXAMETHASONE SODIUM PHOSPHATE 10 MG/ML IJ SOLN
10.0000 mg | Freq: Once | INTRAMUSCULAR | Status: AC
  Administered 2021-08-16: 10 mg via INTRAVENOUS
  Filled 2021-08-16: qty 1

## 2021-08-16 MED ORDER — SODIUM CHLORIDE 0.9 % IV SOLN: INTRAVENOUS | Status: DC

## 2021-08-16 MED ORDER — DEXAMETHASONE SODIUM PHOSPHATE 4 MG/ML IJ SOLN
4.0000 mg | Freq: Three times a day (TID) | INTRAMUSCULAR | Status: AC
  Administered 2021-08-16 (×2): 4 mg via INTRAVENOUS
  Filled 2021-08-16 (×2): qty 1

## 2021-08-16 MED ORDER — SODIUM CHLORIDE 0.9 % IV SOLN
3.0000 g | Freq: Four times a day (QID) | INTRAVENOUS | Status: DC
  Administered 2021-08-16 – 2021-08-17 (×4): 3 g via INTRAVENOUS
  Filled 2021-08-16 (×4): qty 8

## 2021-08-16 MED ORDER — HEPARIN SODIUM (PORCINE) 5000 UNIT/ML IJ SOLN
5000.0000 [IU] | Freq: Three times a day (TID) | INTRAMUSCULAR | Status: DC
  Administered 2021-08-16 – 2021-08-17 (×3): 5000 [IU] via SUBCUTANEOUS
  Filled 2021-08-16 (×3): qty 1

## 2021-08-16 MED ORDER — IOHEXOL 300 MG/ML  SOLN
80.0000 mL | Freq: Once | INTRAMUSCULAR | Status: AC | PRN
  Administered 2021-08-16: 80 mL via INTRAVENOUS

## 2021-08-16 MED ORDER — SODIUM CHLORIDE 0.9 % IV BOLUS
500.0000 mL | Freq: Once | INTRAVENOUS | Status: AC
  Administered 2021-08-16: 500 mL via INTRAVENOUS

## 2021-08-16 MED ORDER — DIPHENHYDRAMINE HCL 50 MG/ML IJ SOLN
25.0000 mg | Freq: Once | INTRAMUSCULAR | Status: AC
  Administered 2021-08-16: 25 mg via INTRAVENOUS
  Filled 2021-08-16: qty 1

## 2021-08-16 MED ORDER — DOCUSATE SODIUM 100 MG PO CAPS: 100.0000 mg | ORAL_CAPSULE | Freq: Two times a day (BID) | ORAL | Status: DC | PRN

## 2021-08-16 MED ORDER — FAMOTIDINE IN NACL 20-0.9 MG/50ML-% IV SOLN
20.0000 mg | Freq: Once | INTRAVENOUS | Status: AC
  Administered 2021-08-16: 20 mg via INTRAVENOUS
  Filled 2021-08-16: qty 50

## 2021-08-16 MED ORDER — SODIUM CHLORIDE 0.9 % IV SOLN
3.0000 g | Freq: Once | INTRAVENOUS | Status: AC
  Administered 2021-08-16: 3 g via INTRAVENOUS
  Filled 2021-08-16: qty 8

## 2021-08-16 MED ORDER — PRENATAL MULTIVITAMIN CH
1.0000 | ORAL_TABLET | Freq: Every day | ORAL | Status: DC
  Administered 2021-08-16: 1 via ORAL
  Filled 2021-08-16 (×2): qty 1

## 2021-08-16 MED ORDER — CHLORHEXIDINE GLUCONATE CLOTH 2 % EX PADS
6.0000 | MEDICATED_PAD | Freq: Every day | CUTANEOUS | Status: DC
  Administered 2021-08-16: 6 via TOPICAL

## 2021-08-16 MED ORDER — TERBUTALINE SULFATE 1 MG/ML IJ SOLN
0.2500 mg | Freq: Once | INTRAMUSCULAR | Status: AC
  Administered 2021-08-16: 0.25 mg via SUBCUTANEOUS

## 2021-08-16 MED ORDER — LACTATED RINGERS IV SOLN: INTRAVENOUS | Status: DC

## 2021-08-16 NOTE — ED Notes (Signed)
CCM providers at bedside 

## 2021-08-16 NOTE — Consult Note (Signed)
NAME:  Jamie Hess, MRN:  [id number], DOB:  Jun 30, 1996, LOS: 0 ADMISSION DATE:  08/16/2021, CONSULTATION DATE:  08/16/21 REFERRING MD:  Nicanor Alcon CHIEF COMPLAINT:  Throat pain   History of Present Illness:  Pt is spanish speaking only; therefore, this HPI was obtained using the STRATUS interpretation app.  Jamie Hess is a 25 y.o. female who has a PMH as below including current 30 6/7 weeks pregnancy (G4P3).  She presented to [facility] ED 10/20 with sudden onset of pain and swelling in her throat.  She was having difficulty controlling secretions due to pain.  Denies fevers, new foods or meds.    On exam, uvula was enlarged and red.  Soft tissue neck XR demonstrated distended hypopharynx with gas and mild to moderately thickened epiglottis and AE folds.  Findings concerning for epiglottitis.  ENT was consulted and Neck CT was ordered.  PCCM asked to admit to ICU for close obs. Since arriving in ED and receiving Decadron, Benadryl, Pepcid, she subjectively feels that swelling and pain has improved.  Family at bedside also states she looks better overall.  OB RRN contacted, category 1 tracing noted with UC's q2-4 minutes through pt denied feeling any UC's.  OB consulted by EDP.  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  Significant Hospital Events: Including procedures, antibiotic start and stop dates in addition to other pertinent events   10/20 > admit.  Interim History / Subjective:  Feels better then when first arrived.  Able to swallow better (though still not at baseline). She is able to speak in full sentences to spanish  interpreter.  Objective:  Blood pressure 111/65, pulse 87, temperature 98.4 F (36.9 C), temperature source Oral, resp. rate 19, last menstrual period 01/04/2021, SpO2 98 %, unknown if currently breastfeeding.        Intake/Output Summary (Last 24 hours) at 08/16/2021 6734 Last data filed at 08/16/2021 1937 Gross per 24 hour  Intake 150 ml  Output --  Net 150 ml   There were no vitals filed for this visit.  Examination: General: Young hispanic female, resting in bed, in NAD. Neuro: A&O x 3, no deficits. HEENT: [place]/AT. Sclerae anicteric. EOMI.  Uvula slightly erythematous and edematous though pt subjectively feels improved. Cardiovascular: RRR, no M/R/G.  Lungs: Respirations even and unlabored.  CTA bilaterally, No W/R/R. Abdomen: BS x 4, soft, NT/ND.  Musculoskeletal: No gross deformities, no edema.  Skin: Intact, warm, no rashes.  Labs/imaging personally reviewed:  Neck Soft tissue XR 10/20 > Soft tissue neck XR demonstrated distended hypopharynx with gas and mild to moderately thickened epiglottis and AE folds.  Findings concerning for epiglottitis. Neck soft tissue CT 10/20 >   Assessment & Plan:   Presumed epiglottitis - CT soft tissue neck pending.  She is s/p 10mg  Decadron, 25mg  Benadryl, 10mg  Pepcid empirically in case there was a component of angioedema at play. - Continue empiric unasyn (EDP discussed with pharmacy and ENT who were in agreement). - F/u on CT neck. - ENT consulted by EDP, appreciate the assistance.  30 6/7 weeks pregnancy. - OB consulted by EDP, appreciate the assistance. - Prenatal vitamin.  Best practice (evaluated daily):  Diet/type: NPO DVT prophylaxis: prophylactic heparin  GI  prophylaxis: H2B Lines: N/A Foley:  N/A Code Status:  full code Last date of multidisciplinary goals of care discussion: None yet.  Labs   CBC: Recent Labs  Lab 08/16/21 0414 08/16/21 0426  WBC 9.8  --   NEUTROABS 7.0  --   HGB 12.9 12.9  HCT 39.1 38.0   MCV 89.5  --   PLT 180  --     Basic Metabolic Panel: Recent Labs  Lab 08/16/21 0426  NA 140  K 3.5  CL 106  GLUCOSE 105*  BUN <3*  CREATININE 0.30*   GFR: CrCl cannot be calculated (Unknown ideal weight.). Recent Labs  Lab 08/16/21 0414  WBC 9.8    Liver Function Tests: No results for input(s): AST, ALT, ALKPHOS, BILITOT, PROT, ALBUMIN in the last 168 hours. No results for input(s): LIPASE, AMYLASE in the last 168 hours. No results for input(s): AMMONIA in the last 168 hours.  ABG    Component Value Date/Time   TCO2 23 08/16/2021 0426     Coagulation Profile: No results for input(s): INR, PROTIME in the last 168 hours.  Cardiac Enzymes: No results for input(s): CKTOTAL, CKMB, CKMBINDEX, TROPONINI in the last 168 hours.  HbA1C: No results found for: HGBA1C  CBG: No results for input(s): GLUCAP in the last 168 hours.  Review of Systems:   All negative; except for those that are bolded, which indicate positives.  Constitutional: weight loss, weight gain, night sweats, fevers, chills, fatigue, weakness.  HEENT: headaches, sore throat, sneezing, nasal congestion, post nasal drip, difficulty swallowing, tooth/dental problems, visual complaints, visual changes, ear aches. Neuro: difficulty with speech, weakness, numbness, ataxia. CV:  chest pain, orthopnea, PND, swelling in lower extremities, dizziness, palpitations, syncope.  Resp: cough, hemoptysis, dyspnea, wheezing. GI: heartburn, indigestion, abdominal pain, nausea, vomiting, diarrhea, constipation, change in bowel habits, loss of appetite, hematemesis, melena, hematochezia.  GU: dysuria, change in color of urine, urgency or frequency, flank pain, hematuria. MSK: joint pain or swelling, decreased range of motion. Psych: change in mood or affect, depression, anxiety, suicidal ideations, homicidal ideations. Skin: rash, itching, bruising.   Past Medical History:  She,  has a past medical history of History  of congenital or genetic condition and Medical history non-contributory.   Surgical History:   Past Surgical History:  Procedure Laterality Date   CESAREAN SECTION     LAPAROSCOPY N/A 05/10/2018   Procedure: LAPAROSCOPY DIAGNOSTIC;  Surgeon: Gaynelle Adu, MD;  Location: [facility] OR;  Service: General;  Laterality: N/A;   UMBILICAL HERNIA REPAIR N/A 05/10/2018   Procedure: OPEN HERNIA REPAIR UMBILICAL ADULT;  Surgeon: Gaynelle Adu, MD;  Location: [facility] OR;  Service: General;  Laterality: N/A;     Social History:   reports that she has never smoked. She has never used smokeless tobacco. She reports that she does not drink alcohol and does not use drugs.   Family History:  Her family history includes Diabetes in her mother, paternal grandfather, and paternal grandmother; Hearing loss in her daughter.   Allergies No Known Allergies   Home Medications  Prior to Admission medications   Medication Sig Start Date End Date Taking? Authorizing Provider  acetaminophen (TYLENOL) 325 MG tablet Take 2 tablets (650 mg total) by mouth every 6 (six) hours as needed (for pain scale < 4). 10/13/19   Fair, Hoyle Sauer, MD  cephALEXin (KEFLEX) 500 MG capsule Take 1 capsule (500 mg total) by mouth 3 (three) times daily. Patient not taking: Reported on 05/17/2021 04/12/20   Sharon Seller,  Marzella Schlein, PA-C  ibuprofen (ADVIL) 600 MG tablet Take 1 tablet (600 mg total) by mouth every 6 (six) hours as needed. Patient not taking: Reported on 05/17/2021 10/13/19   Joselyn Arrow, MD  mupirocin ointment Idelle Jo) 2 % Apply bid to AA Patient not taking: Reported on 05/17/2021 04/12/20   Anders Simmonds, PA-C  Prenatal Vit-Fe Fumarate-FA (PRENATAL VITAMIN PO) Take by mouth.    [provider]     Critical care time: 30 min.   Rutherford Guys, PA - [facility] For pager details, please see AMION or use Epic chat  After 1900, please call [facility] for cross coverage needs 08/16/2021, 6:32  AM

## 2021-08-16 NOTE — ED Provider Notes (Signed)
MSE was initiated and I personally evaluated the patient and placed orders (if any) at  3:56 AM on August 16, 2021.  Patient to ED with onset tonight that woke her up of pain and swelling in her throat. No fever. She is 8 months pregnant, uncomplicated pregnancy, no pregnancy complaints. No new medications, no history of same.   Today's Vitals   08/16/21 0347 08/16/21 0356  BP: 133/79   Pulse: 96   Resp: 15   Temp: 98.4 F (36.9 C)   TempSrc: Oral   SpO2: 99%   PainSc:  10-Worst pain ever   There is no height or weight on file to calculate BMI.  Uvula is swollen, minimally red. No exudates. She is not managing secretions.    The patient appears stable so that the remainder of the MSE may be completed by another provider.   Elpidio Anis, PA-C 08/16/21 1423    Nicanor Alcon, April, MD 08/16/21 [phone]

## 2021-08-16 NOTE — Progress Notes (Signed)
4696 RROB present at bedside of Trauma A.   30 6/7 wks, G4P3 with possible anaphylaxis. Reports positive FM. Denies LOF, denies bleeding. Reports uncomplicated pregnancy with hx of HBP. Hx previous c/s with VBACx2.  ED physician at bedside.  States patient's uvula is enlarged. Has list of medications ordered and wants OB's clearance for administration.   0425 Attached to EFM. Dr. Debroah Loop contacted regarding pt. Cleared medications for administration.   0440 Category I tracing noted.  Uc's q2-4 minutes at this time with abdomen soft to palpation. Pt denies feeling uc's.   0455 Uc's. Q 2-6 minutes, abdomen soft to palpation. Pt. Denies feeling uc's.

## 2021-08-16 NOTE — ED Notes (Signed)
OB rapid response nurse at bedside. 

## 2021-08-16 NOTE — ED Provider Notes (Signed)
[facility] EMERGENCY DEPARTMENT Provider Note   CSN: [id number] Arrival date & time: 08/16/21  0343     History Chief Complaint  Patient presents with   Oral Swelling    Jamie Hess is a 25 y.o. female.  The history is provided by the patient. The history is limited by a language barrier. A language interpreter was used.  Sore Throat This is a new problem. The current episode started 3 to 5 hours ago (sudden onset). The problem occurs constantly. The problem has not changed since onset.Pertinent negatives include no chest pain, no abdominal pain and no headaches. Nothing aggravates the symptoms. Nothing relieves the symptoms. She has tried nothing for the symptoms. The treatment provided no relief.  Patient 8 months pregnant with sudden onset throat pain and spitting secretions.  She denies any ingestion or any pain prior to 3 hours ago.  No f/c/r.  No congestion, no cough.  No rashes on the skin.      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   Medical history non-contributory     Patient Active Problem List   Diagnosis Date Noted   Indication for care in labor or delivery 10/11/2019   Incarcerated umbilical hernia 05/10/2018   VBAC (vaginal birth after Cesarean) 07/17/2017   SVD (spontaneous vaginal delivery) 07/15/2017   PROM (premature rupture of membranes) 07/14/2017   History of cesarean delivery 04/15/2017   Language barrier 04/15/2017   Genetic carrier of other disease 03/21/2017   Family history of deafness and hearing loss 02/25/2017    Past Surgical History:  Procedure Laterality Date   CESAREAN SECTION     LAPAROSCOPY N/A 05/10/2018   Procedure: LAPAROSCOPY DIAGNOSTIC;  Surgeon: Gaynelle Adu, MD;  Location: [facility] OR;  Service: General;  Laterality: N/A;   UMBILICAL  HERNIA REPAIR N/A 05/10/2018   Procedure: OPEN HERNIA REPAIR UMBILICAL ADULT;  Surgeon: Gaynelle Adu, MD;  Location: [facility] OR;  Service: General;  Laterality: N/A;     OB History     Gravida  4   Para  3   Term  3   Preterm      AB      Living  3      SAB      IAB      Ectopic      Multiple  0   Live Births  2        Obstetric Comments  G1: 08/2011 c-section ? Abruption, 37wks, 3kg         Family History  Problem Relation Age of Onset   Hearing loss Daughter    Diabetes Mother    Diabetes Paternal Grandmother    Diabetes Paternal Grandfather     Social History   Tobacco Use   Smoking status: Never   Smokeless tobacco: Never  Vaping Use   Vaping Use: Never used  Substance Use Topics   Alcohol use: No   Drug use: No    Home Medications Prior to Admission medications   Medication Sig Start Date End Date Taking? Authorizing Provider  acetaminophen (TYLENOL) 325 MG tablet Take 2 tablets (650 mg total) by mouth every 6 (six) hours as needed (for pain scale < 4). 10/13/19   Fair, Hoyle Sauer, MD  cephALEXin (KEFLEX) 500 MG capsule Take 1 capsule (500 mg total) by mouth  3 (three) times daily. Patient not taking: Reported on 05/17/2021 04/12/20   Anders Simmonds, PA-C  ibuprofen (ADVIL) 600 MG tablet Take 1 tablet (600 mg total) by mouth every 6 (six) hours as needed. Patient not taking: Reported on 05/17/2021 10/13/19   Joselyn Arrow, MD  mupirocin ointment Idelle Jo) 2 % Apply bid to AA Patient not taking: Reported on 05/17/2021 04/12/20   Anders Simmonds, PA-C  Prenatal Vit-Fe Fumarate-FA (PRENATAL VITAMIN PO) Take by mouth.    [provider]    Allergies    Patient has no known allergies.  Review of Systems   Review of Systems  Unable to perform ROS: Acuity of condition  Constitutional:  Negative for fever.  HENT:  Positive for drooling and sore throat. Negative for congestion and facial swelling.   Eyes:  Negative for redness.   Respiratory:  Negative for wheezing and stridor.   Cardiovascular:  Negative for chest pain.  Gastrointestinal:  Negative for abdominal pain.  Musculoskeletal:  Negative for neck stiffness.  Skin:  Negative for rash.  Neurological:  Negative for headaches.   Physical Exam Updated Vital Signs BP 111/65   Pulse 87   Temp 98.4 F (36.9 C) (Oral)   Resp 19   LMP 01/04/2021 (Approximate)   SpO2 98%   Physical Exam Vitals and nursing note reviewed. Exam conducted with a chaperone present.  Constitutional:      Appearance: Normal appearance. She is not diaphoretic.  HENT:     Head: Normocephalic and atraumatic.     Nose: Nose normal.     Mouth/Throat:     Mouth: Mucous membranes are moist.     Comments: Markedly enlarged erythematous uvula, no swelling of the lips or tongue  Eyes:     Conjunctiva/sclera: Conjunctivae normal.     Pupils: Pupils are equal, round, and reactive to light.  Cardiovascular:     Rate and Rhythm: Normal rate and regular rhythm.     Pulses: Normal pulses.     Heart sounds: Normal heart sounds.  Pulmonary:     Effort: Pulmonary effort is normal.     Breath sounds: Normal breath sounds. No stridor. No wheezing.  Abdominal:     General: Bowel sounds are normal.     Tenderness: There is no abdominal tenderness. There is no guarding.  Musculoskeletal:        General: Normal range of motion.     Cervical back: Normal range of motion and neck supple. No rigidity.  Lymphadenopathy:     Cervical: No cervical adenopathy.  Skin:    General: Skin is warm and dry.     Capillary Refill: Capillary refill takes less than 2 seconds.  Neurological:     General: No focal deficit present.     Mental Status: She is alert and oriented to person, place, and time.     Deep Tendon Reflexes: Reflexes normal.  Psychiatric:        Mood and Affect: Mood normal.        Behavior: Behavior normal.    ED Results / Procedures / Treatments   Labs (all labs ordered are  listed, but only abnormal results are displayed) Results for orders placed or performed during the hospital encounter of 08/16/21  Group A Strep by PCR   Specimen: Throat; Sterile Swab  Result Value Ref Range   Group A Strep by PCR NOT DETECTED NOT DETECTED  Resp Panel by RT-PCR (Flu A&B, Covid) Nasopharyngeal Swab   Specimen:  Nasopharyngeal Swab; Nasopharyngeal(NP) swabs in vial transport medium  Result Value Ref Range   SARS Coronavirus 2 by RT PCR NEGATIVE NEGATIVE   Influenza A by PCR NEGATIVE NEGATIVE   Influenza B by PCR NEGATIVE NEGATIVE  CBC with Differential/Platelet  Result Value Ref Range   WBC 9.8 4.0 - 10.5 K/uL   RBC 4.37 3.87 - 5.11 MIL/uL   Hemoglobin 12.9 12.0 - 15.0 g/dL   HCT 91.4 78.2 - 95.6 %   MCV 89.5 80.0 - 100.0 fL   MCH 29.5 26.0 - 34.0 pg   MCHC 33.0 30.0 - 36.0 g/dL   RDW 21.3 08.6 - 57.8 %   Platelets 180 150 - 400 K/uL   nRBC 0.0 0.0 - 0.2 %   Neutrophils Relative % 72 %   Neutro Abs 7.0 1.7 - 7.7 K/uL   Lymphocytes Relative 19 %   Lymphs Abs 1.8 0.7 - 4.0 K/uL   Monocytes Relative 5 %   Monocytes Absolute 0.5 0.1 - 1.0 K/uL   Eosinophils Relative 3 %   Eosinophils Absolute 0.3 0.0 - 0.5 K/uL   Basophils Relative 0 %   Basophils Absolute 0.0 0.0 - 0.1 K/uL   Immature Granulocytes 1 %   Abs Immature Granulocytes 0.12 (H) 0.00 - 0.07 K/uL  I-stat chem 8, ED (not at [facility] or [facility])  Result Value Ref Range   Sodium 140 135 - 145 mmol/L   Potassium 3.5 3.5 - 5.1 mmol/L   Chloride 106 98 - 111 mmol/L   BUN <3 (L) 6 - 20 mg/dL   Creatinine, Ser 4.69 (L) 0.44 - 1.00 mg/dL   Glucose, Bld 629 (H) 70 - 99 mg/dL   Calcium, Ion 5.28 4.13 - 1.40 mmol/L   TCO2 23 22 - 32 mmol/L   Hemoglobin 12.9 12.0 - 15.0 g/dL   HCT 24.4 01.0 - 27.2 %   DG Neck Soft Tissue  Addendum Date: 08/16/2021   ADDENDUM REPORT: 08/16/2021 05:16 ADDENDUM: Study discussed by telephone with Dr. Cy Blamer on 08/16/2021 at 0508 hours. She advises the clinical picture is  consistent with Epiglottitis, and ENT has been consulted. She is unsure if the patient would tolerate Neck CT at this time. Electronically Signed   By: Odessa Fleming M.D.   On: 08/16/2021 05:16   Result Date: 08/16/2021 CLINICAL DATA:  25 year old female with possible allergic reaction. Uvula swelling. EXAM: NECK SOFT TISSUES - 1+ VIEW COMPARISON:  None. FINDINGS: Hypopharynx is distended with gas, and the epiglottis appears mildly to moderately thickened - although some obliquity of the epiglottis is also suspected on the lateral view. AE folds also appear thickened at the hypopharynx. Tonsillar, uvula and adenoid soft tissue contours appear to remain normal. Normal prevertebral soft tissue contour. Reversal of cervical lordosis. C7 cervical ribs greater on the right (normal variant). No acute osseous abnormality identified. Somewhat low lung volumes, but otherwise negative visible chest. IMPRESSION: Hypopharynx distended with gas and mild to moderately thickened appearance of the epiglottis and AE folds. Other pharyngeal and retropharyngeal contours are normal. Consider acute epiglottitis versus laryngitis. Neck CT (IV contrast preferred) may be valuable exclude. Electronically Signed: By: Odessa Fleming M.D. On: 08/16/2021 04:58   [place] MFM OB FOLLOW UP  Result Date: 08/03/2021 ----------------------------------------------------------------------  OBSTETRICS REPORT                        (Signed Final 08/03/2021 07:18 am) ---------------------------------------------------------------------- Patient Info  ID #:  [id number]                          D.O.B.:  [birth year] (25 yrs)  Name:       ELIVIA ROBOTHAM [facility]            Visit Date: 08/02/2021 09:42 am ---------------------------------------------------------------------- Performed By  Attending:        Lin Landsman      Referred By:       Godfrey Pick                    MD                                        [facility]  Performed By:     Redgie Grayer       [facility] ---------------------------------------------------------------------- Orders  #  Description                           Code        Ordered By  1  [place] MFM OB FOLLOW UP                   95621.30    Lin Landsman ----------------------------------------------------------------------  #  Order #                     Accession #                Episode #  1  [id number]                   [id number]                 [id number] ---------------------------------------------------------------------- Indications  Fetal abnormality - other known or              O35.9XX0  suspected (prior child with Usher syndrome)  Genetic carrier - usher syndrome type 21F        Z14.8  MSS T21 risk 1 in 262  [redacted] weeks gestation of pregnancy                 Z3A.28  History of cesarean delivery, currently         O34.219  pregnant ---------------------------------------------------------------------- Fetal Evaluation  Num Of Fetuses:          1  Fetal Heart Rate(bpm):   120  Cardiac Activity:        Observed  Presentation:            Cephalic  Placenta:                Anterior  P. Cord Insertion:       Previously Visualized  Amniotic Fluid  AFI FV:      Within normal limits  AFI Sum(cm)     %Tile       Largest Pocket(cm)  15.31           54          5.2  RUQ(cm)       RLQ(cm)       LUQ(cm)        LLQ(cm)  5.2           3.94          2.48           3.69 ---------------------------------------------------------------------- Biometry  BPD:      70.9  mm     G. Age:  28w 3d         26  %    CI:        73.77   %    70 - 86                                                          FL/HC:       20.0  %    19.6 - 20.8  HC:      262.2  mm     G. Age:   28w 4d         12  %    HC/AC:       1.04       0.99 - 1.21  AC:      251.1  mm     G. Age:  29w 2d         58  %    FL/BPD:      73.9  %    71 - 87  FL:       52.4  mm     G. Age:  27w 6d         13  %    FL/AC:       20.9  %    20 - 24  LV:        3.7  mm  Est. FW:    1272   gm    2 lb 13 oz     32  % ---------------------------------------------------------------------- OB History  Gravidity:    4         Term:   3  Living:       3 ----------------------------------------------------------------------  Gestational Age  LMP:           30w 0d        Date:  01/04/21                 EDD:   10/11/21  U/S Today:     28w 4d                                        EDD:   10/21/21  Best:          28w 6d     Det. By:  U/S  (05/17/21)          EDD:   10/19/21 ---------------------------------------------------------------------- Anatomy  Cranium:               Appears normal         LVOT:                   Appears normal  Cavum:                 Appears normal         Aortic Arch:            Appears normal  Ventricles:            Appears normal         Ductal Arch:            Appears normal  Choroid Plexus:        Appears normal         Diaphragm:              Previously seen  Cerebellum:            Appears normal         Stomach:                Appears normal, left                                                                        sided  Posterior Fossa:       Appears normal         Abdomen:                Previously seen  Nuchal Fold:           Not applicable (>20    Abdominal Wall:         Previously seen                         wks GA)  Face:                  Orbits and profile     Cord Vessels:           Previously seen                         previously seen  Lips:                  Previously seen  Kidneys:                Appear normal  Palate:                Appears normal         Bladder:                Appears normal  Thoracic:              Previously seen        Spine:                  Previously seen   Heart:                 Appears normal         Upper Extremities:      Previously seen                         (4CH, axis, and                         situs)  RVOT:                  Appears normal         Lower Extremities:      Previously seen  Other:  Heels and 5th digit previously visualized. VC, 3VV and 3VTV          visualized. Female gender previously seen. ---------------------------------------------------------------------- Cervix Uterus Adnexa  Cervix  Not visualized (advanced GA >24wks)  Uterus  No abnormality visualized.  Right Ovary  Within normal limits.  Left Ovary  Within normal limits.  Adnexa  No adnexal mass visualized. ---------------------------------------------------------------------- Impression  Follow up growth due to prior with Usher's syndrome and  increased risk for T21  Normal interval growth with measurements consistent with  dates  Good fetal movement and amniotic fluid volume  No markers of aneuploidy seen today. ---------------------------------------------------------------------- Recommendations  Follow up growth is scheduled in 6 weeks. ----------------------------------------------------------------------               Lin Landsman, MD Electronically Signed Final Report   08/03/2021 07:18 am ----------------------------------------------------------------------   EKG EKG Interpretation  Date/Time:  Thursday August 16 2021 04:26:48 EDT Ventricular Rate:  94 PR Interval:  165 QRS Duration: 92 QT Interval:  318 QTC Calculation: 398 R Axis:   107 Text Interpretation: Right and left arm electrode reversal, interpretation assumes no reversal Sinus or ectopic atrial rhythm Borderline right axis deviation Abnormal T, consider ischemia, lateral leads Confirmed by Nicanor Alcon, Mariangela Heldt ([id number]) on 08/16/2021 4:32:40 AM  Radiology DG Neck Soft Tissue  Addendum Date: 08/16/2021   ADDENDUM REPORT: 08/16/2021 05:16 ADDENDUM: Study discussed by telephone with Dr. Cy Blamer  on 08/16/2021 at 0508 hours. She advises the clinical picture is consistent with Epiglottitis, and ENT has been consulted. She is unsure if the patient would tolerate Neck CT at this time. Electronically Signed   By: Odessa Fleming M.D.   On: 08/16/2021 05:16   Result Date: 08/16/2021 CLINICAL DATA:  25 year old female with possible allergic reaction. Uvula swelling. EXAM: NECK SOFT TISSUES - 1+ VIEW COMPARISON:  None. FINDINGS: Hypopharynx is distended with gas, and the epiglottis appears mildly to moderately thickened - although some obliquity of the epiglottis is also suspected on the lateral view. AE folds also appear thickened at the hypopharynx. Tonsillar, uvula and adenoid soft tissue contours appear to remain  normal. Normal prevertebral soft tissue contour. Reversal of cervical lordosis. C7 cervical ribs greater on the right (normal variant). No acute osseous abnormality identified. Somewhat low lung volumes, but otherwise negative visible chest. IMPRESSION: Hypopharynx distended with gas and mild to moderately thickened appearance of the epiglottis and AE folds. Other pharyngeal and retropharyngeal contours are normal. Consider acute epiglottitis versus laryngitis. Neck CT (IV contrast preferred) may be valuable exclude. Electronically Signed: By: Odessa Fleming M.D. On: 08/16/2021 04:58    Procedures Procedures   Medications Ordered in ED Medications  Ampicillin-Sulbactam (UNASYN) 3 g in sodium chloride 0.9 % 100 mL IVPB (3 g Intravenous New Bag/Given 08/16/21 0534)  0.9 %  sodium chloride infusion ( Intravenous New Bag/Given 08/16/21 0533)  dexamethasone (DECADRON) injection 10 mg (10 mg Intravenous Given 08/16/21 0428)  famotidine (PEPCID) IVPB 20 mg premix (0 mg Intravenous Stopped 08/16/21 0513)  diphenhydrAMINE (BENADRYL) injection 25 mg (25 mg Intravenous Given 08/16/21 0428)    ED Course  I have reviewed the triage vital signs and the nursing notes.  Pertinent labs & imaging results that were  available during my care of the patient were reviewed by me and considered in my medical decision making (see chart for details).    MDM Rules/Calculators/A&P                           MDM   Amount and/or Complexity of Data Reviewed Clinical lab tests: ordered Decide to obtain previous medical records or to obtain history from someone other than the patient: yes Review and summarize past medical records: yes Independent visualization of images, tracings, or specimens: yes  Risk of Complications, Morbidity, and/or Mortality Presenting problems: high Diagnostic procedures: high Management options: high  Critical Care Total time providing critical care: 30-74 minutes (allergic reaction treatment multiple reassessments)   MDM Interpretation: labs, x-ray, CT scan and ECG (normal labs, epiglottis enlarged on Xray) Total time providing critical care: 30-74 minutes (allergic reaction treatment multiple reassessments). This excludes time spent performing separately reportable procedures and services. Consults: critical care and OB/GYN (rapid response at bedside case d/w Dr. Debroah Loop.  Dr. Annalee Genta of ENT)   No longer spitting post medications   Final Clinical Impression(s) / ED Diagnoses Final diagnoses:  Uvulitis   Admit to ICU Rx / DC Orders ED Discharge Orders     None        Nahlia Hellmann, MD 08/16/21 [phone]

## 2021-08-16 NOTE — Progress Notes (Signed)
Dr. Debroah Loop contacted by RROB Category I tracing and patient's uc's.  Made aware that patient denies feeling uc's.  Declines SVE at this time.   Made aware that patient is being admitted for possible epiglottitis.  Consult for ENT made and ED physician would be in contact regarding further care.    0507 patient disconnected from EFM.

## 2021-08-16 NOTE — ED Notes (Signed)
Dr. Nicanor Alcon at bedside,

## 2021-08-16 NOTE — ED Triage Notes (Signed)
Patient with uvula swelling and pain.  Patient is having a hard time controlling oral secretions due to pain.  Patient is not able to swallow secretions.  Patient is 8 months pregnant.

## 2021-08-16 NOTE — Progress Notes (Signed)
Call received from [facility] ED RN for patient presenting with anaphylaxis to Trauma A.

## 2021-08-16 NOTE — ED Notes (Signed)
X-ray at bedside

## 2021-08-17 ENCOUNTER — Other Ambulatory Visit (HOSPITAL_COMMUNITY): Payer: Self-pay

## 2021-08-17 ENCOUNTER — Telehealth: Payer: Self-pay | Admitting: *Deleted

## 2021-08-17 DIAGNOSIS — O4703 False labor before 37 completed weeks of gestation, third trimester: Secondary | ICD-10-CM

## 2021-08-17 DIAGNOSIS — Z3A31 31 weeks gestation of pregnancy: Secondary | ICD-10-CM

## 2021-08-17 LAB — CBC
HCT: 34.9 % — ABNORMAL LOW (ref 36.0–46.0)
Hemoglobin: 11.2 g/dL — ABNORMAL LOW (ref 12.0–15.0)
MCH: 29 pg (ref 26.0–34.0)
MCHC: 32.1 g/dL (ref 30.0–36.0)
MCV: 90.4 fL (ref 80.0–100.0)
Platelets: 178 10*3/uL (ref 150–400)
RBC: 3.86 MIL/uL — ABNORMAL LOW (ref 3.87–5.11)
RDW: 14.6 % (ref 11.5–15.5)
WBC: 13.3 10*3/uL — ABNORMAL HIGH (ref 4.0–10.5)
nRBC: 0 % (ref 0.0–0.2)

## 2021-08-17 LAB — BASIC METABOLIC PANEL
Anion gap: 9 (ref 5–15)
BUN: 5 mg/dL — ABNORMAL LOW (ref 6–20)
CO2: 18 mmol/L — ABNORMAL LOW (ref 22–32)
Calcium: 8.6 mg/dL — ABNORMAL LOW (ref 8.9–10.3)
Chloride: 110 mmol/L (ref 98–111)
Creatinine, Ser: 0.58 mg/dL (ref 0.44–1.00)
GFR, Estimated: >60 mL/min (ref >60)
Glucose, Bld: 141 mg/dL — ABNORMAL HIGH (ref 70–99)
Potassium: 3.8 mmol/L (ref 3.5–5.1)
Sodium: 137 mmol/L (ref 135–145)

## 2021-08-17 LAB — C4 COMPLEMENT
Complement C4, Body Fluid: 23 mg/dL (ref 12–38)
Complement C4, Body Fluid: 23 mg/dL (ref 12–38)

## 2021-08-17 LAB — PHOSPHORUS: Phosphorus: 4.1 mg/dL (ref 2.5–4.6)

## 2021-08-17 LAB — MAGNESIUM: Magnesium: 1.8 mg/dL (ref 1.7–2.4)

## 2021-08-17 LAB — FETAL FIBRONECTIN: Fetal Fibronectin: NEGATIVE

## 2021-08-17 MED ORDER — INDOMETHACIN 25 MG PO CAPS
25.0000 mg | ORAL_CAPSULE | Freq: Four times a day (QID) | ORAL | Status: DC
  Administered 2021-08-17: 25 mg via ORAL
  Filled 2021-08-17 (×3): qty 1

## 2021-08-17 MED ORDER — ONDANSETRON HCL 4 MG/2ML IJ SOLN
4.0000 mg | Freq: Four times a day (QID) | INTRAMUSCULAR | Status: DC | PRN
Start: 2021-08-17 — End: 2021-08-17
  Administered 2021-08-17: 4 mg via INTRAVENOUS
  Filled 2021-08-17: qty 2

## 2021-08-17 MED ORDER — CEFDINIR 300 MG PO CAPS
300.0000 mg | ORAL_CAPSULE | Freq: Two times a day (BID) | ORAL | Status: DC
  Filled 2021-08-17 (×2): qty 1

## 2021-08-17 MED ORDER — CEFDINIR 300 MG PO CAPS: 300.0000 mg | ORAL_CAPSULE | Freq: Two times a day (BID) | ORAL | 0 refills | Status: DC

## 2021-08-17 MED ORDER — CEFDINIR 300 MG PO CAPS
300.0000 mg | ORAL_CAPSULE | Freq: Two times a day (BID) | ORAL | 0 refills | Status: DC
  Filled 2021-08-17: qty 12, 6d supply, fill #0

## 2021-08-17 MED ORDER — INDOMETHACIN 25 MG PO CAPS
50.0000 mg | ORAL_CAPSULE | Freq: Once | ORAL | Status: AC
  Administered 2021-08-17: 50 mg via ORAL
  Filled 2021-08-17: qty 2

## 2021-08-17 NOTE — Plan of Care (Signed)
Completed and met prior to discharge

## 2021-08-17 NOTE — Progress Notes (Signed)
eLink Physician-Brief Progress Note Patient Name: Jamie Hess DOB: 1996-06-21 MRN: [id number]   Date of Service  08/17/2021  HPI/Events of Note  Patient had some contractions which have been addressed by the OB attending, he would like patient to ultimately be transferred to the [facility] specialty floor on the Hospitalist service when she is okay to be transferred, patient still has slight residual throat discomfort but overall feels much better.  eICU Interventions  Will defer any transfer until AM.        Thomasene Lot Tilly Pernice 08/17/2021, 1:18 AM

## 2021-08-17 NOTE — Progress Notes (Signed)
RROB contacted Dr. Donavan Foil regarding critical care physician note regarding transfer.    Expressed concern with staying at bedside for continuous monitoring versus transfer to [facility] specialty care. Order received for continuous TOCO monitoring with hourly assessment of strip.  ICU RN to contact RROB with change in uc's or patient presentation.   Category I tracing a this time with uc's q 2-6 minutes. Dr. Donavan Foil aware.

## 2021-08-17 NOTE — Progress Notes (Signed)
RROB arrival at bedside of 53M.   2328 Pt attached to EFM at bedside.  Pt denies LOF, denies bleeding.  Reports positive FM. Reports feeling lower abdominal pressure "coming and going" and mild discomfort from her back radiating to her pelvis.  2345 Dr. Donavan Foil contacted regarding patient. Order received for FFN, terb, and 500cc fluid bolus.  0000 Category I tracing noted. Uc's q 1-3 minutes. Abdomen soft upon palpation.  2641 Contacted Dr. Donavan Foil. Uc's q 1-2.5 minutes. Abdomen continues to palpate softly. Patient declines pressure at this time.  Reports mild discomfort. Order received for indocin.    Dr. Donavan Foil present at bedside on 53M at 0035. FFN negative.

## 2021-08-17 NOTE — Progress Notes (Signed)
Patient discharged home via car with husband.  Discharge instructions and prescriptions explained and both fully understood.  Vital signs and assessment  prior to discharged wnl.

## 2021-08-17 NOTE — Progress Notes (Signed)
Patient states she feels much better through interpreter.  No contractions seen on EFM and/or palpated.  No bleeding or leaking noted or reported. Dr Jaymes Graff updated and patient to come off continuous monitoring.  Continue to doppler FHT's qd.  If staff or patient has any questions or concerns, please contact RROB RN at [phone].   Marvell Fuller RNC-OB, RROB

## 2021-08-17 NOTE — Telephone Encounter (Signed)
Patietn currently hospitalized- will send surgery information through My Chart message.

## 2021-08-17 NOTE — Progress Notes (Signed)
eLink Physician-Brief Progress Note Patient Name: Jamie Hess DOB: 07-01-1996 MRN: [id number]   Date of Service  08/17/2021  HPI/Events of Note  Patient is nauseated. QTc  0.40.  eICU Interventions  PRN Zofran ordered.        Thomasene Lot [name] 08/17/2021, 6:32 AM

## 2021-08-17 NOTE — Discharge Summary (Signed)
Physician Discharge Summary  Patient ID: Jamie Hess MRN: [id number] DOB/AGE: April 12, 1996 25 y.o.  Admit date: 08/16/2021 Discharge date: 08/17/2021  Admission Diagnoses: Epiglottitis [J05.10] Uvulitis [K12.2] Laryngitis [J04.0] Allergic reaction, initial encounter [T78.40XA]   Discharge Diagnoses:  Active Problems:   Epiglottitis   Discharged Condition: good  Hospital Course:  25 year old pregnant female, currently 31 weeks who presented with throat swelling and difficulty swallowing,  choking and talking that began at 11 pm and progressively worsened until she arrived to the ED early the morning of . Neck XR with distended hypopharynx and thickened epiglottis, CT Neck with indistinct uvula and soft palate but no airway compromise or inflammation. O2 saturations >97%. ENT consulted by ED. She was started on dexamethasone 10 mg, benadryl 25 mg, pepcid 20 mg and unasyn. PCCM consulted for admission for airway monitoring in ICU. Since the steroids improved and was able to talk and swallow without issue, both solids and liquids. She was treated empirically for Epiglottitis total 7 days of antibiotics and discharged on cefdinir for 6 more days. She had some pre-term contractions treated with tocolytics while in the hospital but these resolved. She was cleared by Ob for discharge to follow up on 10/31 for her scheduled appointment. Presumed infectious epiglottitis.   Consults:  ICU , OB  Significant Diagnostic Studies:  CT Soft tissue neck show on 10/20 shows: 1. CT demonstrates a normal epiglottis, and no convincing laryngeal edema. The uvula and soft palate do appear indistinct, but there is no significant pharyngitis or airway compromise.   2. Trace fluid level in the right maxillary sinus. Otherwise negative CT appearance of the Neck.  Treatments: Steroids, IV hydration, antibiotics  Discharge Exam: Blood pressure 105/61, pulse 88, temperature 97.9 F (36.6 C),  temperature source Oral, resp. rate 19, height 5\' 3"  (1.6 m), weight 82.1 kg, last menstrual period 01/04/2021, SpO2 97 %, unknown if currently breastfeeding.  General: Young female, resting in bed, in NAD. Neuro: A&O x 3, no deficits. HEENT: [place]/AT. Sclerae anicteric. EOMI. No oral edema, no cervical adenopathy, mallampati IV, uvula is not deviated.  Cardiovascular: RRR, no M/R/G.  Lungs: Respirations even and unlabored.  CTA bilaterally, No W/R/R. Abdomen: BS x 4, soft, NT/ND.  Musculoskeletal: No gross deformities, no edema.  Skin: Intact, warm, no rashes.   Disposition: Good, to home.    Allergies as of 08/17/2021   No Known Allergies      Medication List     STOP taking these medications    acetaminophen 325 MG tablet Commonly known as: Tylenol   cephALEXin 500 MG capsule Commonly known as: KEFLEX   ibuprofen 600 MG tablet Commonly known as: ADVIL   mupirocin ointment 2 % Commonly known as: Bactroban       TAKE these medications    cefdinir 300 MG capsule Commonly known as: OMNICEF Take 1 capsule (300 mg total) by mouth every 12 (twelve) hours.   PRENATAL VITAMIN PO Take 1 tablet by mouth daily after breakfast.         Signed: 08/19/2021 08/17/2021, 10:49 AM

## 2021-08-17 NOTE — Progress Notes (Signed)
RROB contacted Dr. Donavan Foil regarding patient's uterine activity.  Uc's q 3-7 minutes at this time. Patient reports feeling vaginal pressure.  SVE performed at 0345. Fingertip and -3 station, vtx.   Instructed RROB to check uterine activity via TOCO q 2 hrs.

## 2021-08-17 NOTE — Consult Note (Signed)
OB/GYN Consult Note  Referring Provider: April Palumbo  Jamie Hess is a 25 y.o. [phone] at 31 weeks admitted for epiglottitis. OB consulted for general obstetric care.  Pt was admitted with epiglottitis.  She was treated by the critical care team with steroids and unasyn.  Per nursing staff, this evening she began to have some mild pelvic discomfort and contractions were seen on the tocometer.  The patient receives her care with [facility].  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  She has had a cesarean section and 2 successful VBAC deliveries.      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   Medical history non-contributory     Past Surgical History:  Procedure Laterality Date   CESAREAN SECTION     LAPAROSCOPY N/A 05/10/2018   Procedure: LAPAROSCOPY DIAGNOSTIC;  Surgeon: Gaynelle Adu, MD;  Location[facility] OR;  Service: General;  Laterality: N/A;   UMBILICAL HERNIA REPAIR N/A 05/10/2018   Procedure: OPEN HERNIA REPAIR UMBILICAL ADULT;  Surgeon: Gaynelle Adu, MD;  Location: [facility] OR;  Service: General;  Laterality: N/A;    OB History  Gravida Para Term Preterm AB Living  4 3 3     3   SAB IAB Ectopic Multiple Live Births        0 2    # Outcome Date GA Lbr Len/2nd Weight Sex Delivery Anes PTL Lv  4 Current           3 Term 10/12/19 [redacted]w[redacted]d 06:21 / 00:07 3306 g F Vag-Spont EPI  LIV  2 Term 07/15/17 [redacted]w[redacted]d 08:58 / 00:18 3756 g F VBAC EPI, Local  LIV  1 Term 2012     CS-Unspec       Obstetric Comments  G1: 08/2011 c-section ? Abruption, 37wks, 3kg    Social History   Socioeconomic History   Marital status: Single    Spouse name: Not on file   Number of children: Not on file   Years of education: Not on file   Highest education level: Not on file  Occupational History   Not on file   Tobacco Use   Smoking status: Never   Smokeless tobacco: Never  Vaping Use   Vaping Use: Never used  Substance and Sexual Activity   Alcohol use: No   Drug use: No   Sexual activity: Yes    Birth control/protection: None  Other Topics Concern   Not on file  Social History Narrative   Not on file   Social Determinants of Health   Financial Resource Strain: Not on file  Food Insecurity: Not on file  Transportation Needs: Not on file  Physical Activity: Not on file  Stress: Not on file  Social Connections: Not on file    Family History  Problem Relation Age of Onset   Hearing loss Daughter    Diabetes Mother    Diabetes Paternal Grandmother    Diabetes Paternal Grandfather     Medications Prior to Admission  Medication Sig Dispense Refill Last Dose   Prenatal Vit-Fe Fumarate-FA (PRENATAL VITAMIN PO) Take 1 tablet by mouth daily after breakfast.   08/15/2021 at am   acetaminophen (TYLENOL) 325 MG tablet Take 2 tablets (650 mg total) by mouth every 6 (six) hours as needed (for pain scale < 4). (Patient not taking: Reported on 08/16/2021)  30 tablet 0 Not Taking   cephALEXin (KEFLEX) 500 MG capsule Take 1 capsule (500 mg total) by mouth 3 (three) times daily. (Patient not taking: No sig reported) 30 capsule 0 Not Taking   ibuprofen (ADVIL) 600 MG tablet Take 1 tablet (600 mg total) by mouth every 6 (six) hours as needed. (Patient not taking: No sig reported) 30 tablet 0 Not Taking   mupirocin ointment (BACTROBAN) 2 % Apply bid to AA (Patient not taking: Reported on 08/16/2021) 22 g 0 Not Taking    No Known Allergies  Review of Systems: Negative except for what is mentioned in HPI.     Physical Exam: BP (!) 113/47   Pulse (!) 125   Temp 98 F (36.7 C) (Oral)   Resp 19   LMP 01/04/2021 (Approximate)   SpO2 96%  CONSTITUTIONAL: Well-developed, well-nourished female in mild distress.  HENT:  Normocephalic, atraumatic, External right and left ear normal. Oropharynx is  clear and moist EYES: Conjunctivae and EOM are normal.   NECK: Normal range of motion, supple, no masses SKIN: Skin is warm and dry. No rash noted. Not diaphoretic. No erythema. No pallor. NEUROLGIC: Alert and oriented to person, place, and time. Normal reflexes, muscle tone coordination. No cranial nerve deficit noted. PSYCHIATRIC: Normal mood and affect. Normal behavior. Normal judgment and thought content. CARDIOVASCULAR: Normal heart rate noted, regular rhythm RESPIRATORY: Effort and breath sounds normal, no problems with respiration noted ABDOMEN: Soft, nontender, nondistended, gravid. Well-healed Pfannenstiel incision. PELVIC: deferred MUSCULOSKELETAL: Normal range of motion. No edema and no tenderness. 2+ distal pulses.  Initial eval: toco with ctx q 2-3 minutes FHT:  Pertinent Labs/Studies:   Results for orders placed or performed during the hospital encounter of 08/16/21 (from the past 72 hour(s))  CBC with Differential/Platelet     Status: Abnormal   Collection Time: 08/16/21  4:14 AM  Result Value Ref Range   WBC 9.8 4.0 - 10.5 K/uL   RBC 4.37 3.87 - 5.11 MIL/uL   Hemoglobin 12.9 12.0 - 15.0 g/dL   HCT 37.6 28.3 - 15.1 %   MCV 89.5 80.0 - 100.0 fL   MCH 29.5 26.0 - 34.0 pg   MCHC 33.0 30.0 - 36.0 g/dL   RDW 76.1 60.7 - 37.1 %   Platelets 180 150 - 400 K/uL   nRBC 0.0 0.0 - 0.2 %   Neutrophils Relative % 72 %   Neutro Abs 7.0 1.7 - 7.7 K/uL   Lymphocytes Relative 19 %   Lymphs Abs 1.8 0.7 - 4.0 K/uL   Monocytes Relative 5 %   Monocytes Absolute 0.5 0.1 - 1.0 K/uL   Eosinophils Relative 3 %   Eosinophils Absolute 0.3 0.0 - 0.5 K/uL   Basophils Relative 0 %   Basophils Absolute 0.0 0.0 - 0.1 K/uL   Immature Granulocytes 1 %   Abs Immature Granulocytes 0.12 (H) 0.00 - 0.07 K/uL    Comment: Performed at [facility], [address], [place], [place] [address]  Group A Strep by PCR     Status: None   Collection Time: 08/16/21  4:15 AM   Specimen: Throat;  Sterile Swab  Result Value Ref Range   Group A Strep by PCR NOT DETECTED NOT DETECTED    Comment: Performed at [facility], [address], [place], [place] [address]  Resp Panel by RT-PCR (Flu A&B, Covid) Nasopharyngeal Swab     Status: None   Collection Time: 08/16/21  4:15 AM  Specimen: Nasopharyngeal Swab; Nasopharyngeal(NP) swabs in vial transport medium  Result Value Ref Range   SARS Coronavirus 2 by RT PCR NEGATIVE NEGATIVE    Comment: (NOTE) SARS-CoV-2 target nucleic acids are NOT DETECTED.  The SARS-CoV-2 RNA is generally detectable in upper respiratory specimens during the acute phase of infection. The lowest concentration of SARS-CoV-2 viral copies this assay can detect is 138 copies/mL. A negative result does not preclude SARS-Cov-2 infection and should not be used as the sole basis for treatment or other patient management decisions. A negative result may occur with  improper specimen collection/handling, submission of specimen other than nasopharyngeal swab, presence of viral mutation(s) within the areas targeted by this assay, and inadequate number of viral copies(<138 copies/mL). A negative result must be combined with clinical observations, patient history, and epidemiological information. The expected result is Negative.  Fact Sheet for Patients:  BloggerCourse.com  Fact Sheet for Healthcare Providers:  SeriousBroker.it  This test is no t yet approved or cleared by the [place] FDA and  has been authorized for detection and/or diagnosis of SARS-CoV-2 by FDA under an Emergency Use Authorization (EUA). This EUA will remain  in effect (meaning this test can be used) for the duration of the COVID-19 declaration under Section 564(b)(1) of the Act, 21 U.S.C.section 360bbb-3(b)(1), unless the authorization is terminated  or revoked sooner.       Influenza A by PCR NEGATIVE NEGATIVE   Influenza B by PCR  NEGATIVE NEGATIVE    Comment: (NOTE) The Xpert Xpress SARS-CoV-2/FLU/RSV plus assay is intended as an aid in the diagnosis of influenza from Nasopharyngeal swab specimens and should not be used as a sole basis for treatment. Nasal washings and aspirates are unacceptable for Xpert Xpress SARS-CoV-2/FLU/RSV testing.  Fact Sheet for Patients: BloggerCourse.com  Fact Sheet for Healthcare Providers: SeriousBroker.it  This test is not yet approved or cleared by the [place] FDA and has been authorized for detection and/or diagnosis of SARS-CoV-2 by FDA under an Emergency Use Authorization (EUA). This EUA will remain in effect (meaning this test can be used) for the duration of the COVID-19 declaration under Section 564(b)(1) of the Act, 21 U.S.C. section 360bbb-3(b)(1), unless the authorization is terminated or revoked.  Performed at [facility], [address] [place], [place] [address]   I-stat chem 8, ED (not at [facility] or [facility])     Status: Abnormal   Collection Time: 08/16/21  4:26 AM  Result Value Ref Range   Sodium 140 135 - 145 mmol/L   Potassium 3.5 3.5 - 5.1 mmol/L   Chloride 106 98 - 111 mmol/L   BUN <3 (L) 6 - 20 mg/dL   Creatinine, Ser 6.04 (L) 0.44 - 1.00 mg/dL   Glucose, Bld 540 (H) 70 - 99 mg/dL    Comment: Glucose reference range applies only to samples taken after fasting for at least 8 hours.   Calcium, Ion 1.23 1.15 - 1.40 mmol/L   TCO2 23 22 - 32 mmol/L   Hemoglobin 12.9 12.0 - 15.0 g/dL   HCT 98.1 19.1 - 47.8 %  HIV Antibody (routine testing w rflx)     Status: None   Collection Time: 08/16/21  9:30 AM  Result Value Ref Range   HIV Screen 4th Generation wRfx Non Reactive Non Reactive    Comment: Performed at [facility], [address], [place], [place] [address]  Comprehensive metabolic panel     Status: Abnormal   Collection Time: 08/16/21  9:30 AM  Result Value Ref Range   Sodium 139 135  - 145 mmol/L   Potassium 4.0 3.5 - 5.1 mmol/L   Chloride 110 98 - 111 mmol/L   CO2 22 22 - 32 mmol/L   Glucose, Bld 121 (H) 70 - 99 mg/dL    Comment: Glucose reference range applies only to samples taken after fasting for at least 8 hours.   BUN <5 (L) 6 - 20 mg/dL   Creatinine, Ser 2.24 0.44 - 1.00 mg/dL   Calcium 8.8 (L) 8.9 - 10.3 mg/dL   Total Protein 6.1 (L) 6.5 - 8.1 g/dL   Albumin 3.0 (L) 3.5 - 5.0 g/dL   AST 13 (L) 15 - 41 U/L   ALT 14 0 - 44 U/L   Alkaline Phosphatase 72 38 - 126 U/L   Total Bilirubin 0.2 (L) 0.3 - 1.2 mg/dL   GFR, Estimated >49 >75 mL/min    Comment: (NOTE) Calculated using the CKD-EPI Creatinine Equation (2021)    Anion gap 7 5 - 15    Comment: Performed at [facility], [address], [place], [place] [address]  Sedimentation rate     Status: Abnormal   Collection Time: 08/16/21  9:30 AM  Result Value Ref Range   Sed Rate 25 (H) 0 - 22 mm/hr    Comment: Performed at [facility], [address] [place], [place] [address]  MRSA Next Gen by PCR, Nasal     Status: None   Collection Time: 08/16/21  3:05 PM   Specimen: Nasal Mucosa; Nasal Swab  Result Value Ref Range   MRSA by PCR Next Gen NOT DETECTED NOT DETECTED    Comment: (NOTE) The GeneXpert MRSA Assay (FDA approved for NASAL specimens only), is one component of a comprehensive MRSA colonization surveillance program. It is not intended to diagnose MRSA infection nor to guide or monitor treatment for MRSA infections. Test performance is not FDA approved in patients less than 59 years old. Performed at [facility], [address] [place], [place] [address]   Glucose, capillary     Status: Abnormal   Collection Time: 08/16/21  3:08 PM  Result Value Ref Range   Glucose-Capillary 113 (H) 70 - 99 mg/dL    Comment: Glucose reference range applies only to samples taken after fasting for at least 8 hours.  Comprehensive metabolic panel     Status: Abnormal   Collection Time:  08/16/21  4:05 PM  Result Value Ref Range   Sodium 140 135 - 145 mmol/L   Potassium 3.9 3.5 - 5.1 mmol/L   Chloride 109 98 - 111 mmol/L   CO2 21 (L) 22 - 32 mmol/L   Glucose, Bld 106 (H) 70 - 99 mg/dL    Comment: Glucose reference range applies only to samples taken after fasting for at least 8 hours.   BUN <5 (L) 6 - 20 mg/dL   Creatinine, Ser 7.01 0.44 - 1.00 mg/dL   Calcium 8.9 8.9 - 41.0 mg/dL   Total Protein 6.2 (L) 6.5 - 8.1 g/dL   Albumin 3.0 (L) 3.5 - 5.0 g/dL   AST 12 (L) 15 - 41 U/L   ALT 13 0 - 44 U/L   Alkaline Phosphatase 72 38 - 126 U/L   Total Bilirubin 0.8 0.3 - 1.2 mg/dL   GFR, Estimated >30 >13 mL/min    Comment: (NOTE) Calculated using the CKD-EPI Creatinine Equation (2021)    Anion gap 10 5 -  15    Comment: Performed at [facility], [address], [place], [place] [address]  Protein / creatinine ratio, urine     Status: Abnormal   Collection Time: 08/16/21  4:17 PM  Result Value Ref Range   Creatinine, Urine 49.45 mg/dL   Total Protein, Urine 10 mg/dL    Comment: NO NORMAL RANGE ESTABLISHED FOR THIS TEST   Protein Creatinine Ratio 0.20 (H) 0.00 - 0.15 mg/mg[Cre]    Comment: Performed at [facility], [address] [place], [place] [address]  Glucose, capillary     Status: None   Collection Time: 08/16/21  8:29 PM  Result Value Ref Range   Glucose-Capillary 79 70 - 99 mg/dL    Comment: Glucose reference range applies only to samples taken after fasting for at least 8 hours.  Fetal fibronectin     Status: None   Collection Time: 08/17/21 12:01 AM  Result Value Ref Range   Fetal Fibronectin NEGATIVE NEGATIVE    Comment: Performed at [facility], [address], [place], [place] [address]       Assessment and Plan :Jamie Hess is a 25 y.o. [phone] admitted for epiglottitis and pregnancy at 31 weeks.   The patient has been managed by the critical care team.  Epiglottitis is resolving with current treatment. Pt had preterm  ctx.  FFN was negative.  She did not respond to terbutaline, but indomethicin seemed to decrease uterine activity. Will hold magnesium sulfate and betamethasone for now.   Thank you for this consult, we will follow along for fetal monitoring and control of preterm contractions.   For OB/GYN questions, please call the Center for [facility] at [facility] Monday - Friday, 8 am - 5 pm: [phone] All other times: [phone]    Mariel Aloe, M.D. Attending Obstetrician & Gynecologist, [facility] for [employer], [facility]

## 2021-08-17 NOTE — Progress Notes (Signed)
RROB received call from RN on Jamie Hess for patient with complaint of lower abdominal pressure.   RROB en route to Jamie Hess 07.

## 2021-08-17 NOTE — Progress Notes (Signed)
FACULTY PRACTICE ANTEPARTUM COMPREHENSIVE PROGRESS NOTE  Jamie Hess is a 25 y.o. [phone] at [redacted]w[redacted]d who is admitted for epiglottis.  Estimated Date of Delivery: 10/19/21  Length of Stay:  1 Days. Admitted 08/16/2021  Subjective: Today she feels well and ready to go home.  Patient reports good fetal movement.  She reports no uterine contractions, no bleeding and no loss of fluid per vagina.  Vitals:  Blood pressure 114/68, pulse 89, temperature 97.7 F (36.5 C), temperature source Oral, resp. rate (!) 22, height 5\' 3"  (1.6 m), weight 82.1 kg, last menstrual period 01/04/2021, SpO2 96 %, unknown if currently breastfeeding. Physical Examination: CONSTITUTIONAL: Well-developed, well-nourished female in no acute distress.  NEUROLOGIC: Alert and oriented to person, place, and time. No cranial nerve deficit noted. PSYCHIATRIC: Normal mood and affect. Normal behavior. Normal judgment and thought content. CARDIOVASCULAR: Normal heart rate noted, regular rhythm RESPIRATORY: Effort and breath sounds normal, no problems with respiration noted MUSCULOSKELETAL: Normal range of motion. No edema and no tenderness. 2+ distal pulses. ABDOMEN: Soft, nontender, nondistended, gravid. CERVIX: Dilation: Fingertip Station: -3 Presentation: Vertex Exam by:: [email or web address], RN   Uterine activity: no contractions per hour - unable to pull up OBIX but per RR nurse, ctxns stopped on the monitor  Results for orders placed or performed during the hospital encounter of 08/16/21 (from the past 48 hour(s))  CBC with Differential/Platelet     Status: Abnormal   Collection Time: 08/16/21  4:14 AM  Result Value Ref Range   WBC 9.8 4.0 - 10.5 K/uL   RBC 4.37 3.87 - 5.11 MIL/uL   Hemoglobin 12.9 12.0 - 15.0 g/dL   HCT 08/18/21 18.8 - 41.6 %   MCV 89.5 80.0 - 100.0 fL   MCH 29.5 26.0 - 34.0 pg   MCHC 33.0 30.0 - 36.0 g/dL   RDW 60.6 30.1 - 60.1 %   Platelets 180 150 - 400 K/uL   nRBC 0.0 0.0 - 0.2 %    Neutrophils Relative % 72 %   Neutro Abs 7.0 1.7 - 7.7 K/uL   Lymphocytes Relative 19 %   Lymphs Abs 1.8 0.7 - 4.0 K/uL   Monocytes Relative 5 %   Monocytes Absolute 0.5 0.1 - 1.0 K/uL   Eosinophils Relative 3 %   Eosinophils Absolute 0.3 0.0 - 0.5 K/uL   Basophils Relative 0 %   Basophils Absolute 0.0 0.0 - 0.1 K/uL   Immature Granulocytes 1 %   Abs Immature Granulocytes 0.12 (H) 0.00 - 0.07 K/uL    Comment: Performed at [facility], [address], [place], [place]  Group A Strep by PCR     Status: None   Collection Time: 08/16/21  4:15 AM   Specimen: Throat; Sterile Swab  Result Value Ref Range   Group A Strep by PCR NOT DETECTED NOT DETECTED    Comment: Performed at [facility], [address], [place], [place]  Resp Panel by RT-PCR (Flu A&B, Covid) Nasopharyngeal Swab     Status: None   Collection Time: 08/16/21  4:15 AM   Specimen: Nasopharyngeal Swab; Nasopharyngeal(NP) swabs in vial transport medium  Result Value Ref Range   SARS Coronavirus 2 by RT PCR NEGATIVE NEGATIVE    Comment: (NOTE) SARS-CoV-2 target nucleic acids are NOT DETECTED.  The SARS-CoV-2 RNA is generally detectable in upper respiratory specimens during the acute phase of infection. The lowest concentration of SARS-CoV-2 viral copies this assay can detect is 138 copies/mL.  A negative result does not preclude SARS-Cov-2 infection and should not be used as the sole basis for treatment or other patient management decisions. A negative result may occur with  improper specimen collection/handling, submission of specimen other than nasopharyngeal swab, presence of viral mutation(s) within the areas targeted by this assay, and inadequate number of viral copies(<138 copies/mL). A negative result must be combined with clinical observations, patient history, and epidemiological information. The expected result is Negative.  Fact Sheet for Patients:   BloggerCourse.com  Fact Sheet for Healthcare Providers:  SeriousBroker.it  This test is no t yet approved or cleared by the [place] FDA and  has been authorized for detection and/or diagnosis of SARS-CoV-2 by FDA under an Emergency Use Authorization (EUA). This EUA will remain  in effect (meaning this test can be used) for the duration of the COVID-19 declaration under Section 564(b)(1) of the Act, 21 U.S.C.section 360bbb-3(b)(1), unless the authorization is terminated  or revoked sooner.       Influenza A by PCR NEGATIVE NEGATIVE   Influenza B by PCR NEGATIVE NEGATIVE    Comment: (NOTE) The Xpert Xpress SARS-CoV-2/FLU/RSV plus assay is intended as an aid in the diagnosis of influenza from Nasopharyngeal swab specimens and should not be used as a sole basis for treatment. Nasal washings and aspirates are unacceptable for Xpert Xpress SARS-CoV-2/FLU/RSV testing.  Fact Sheet for Patients: BloggerCourse.com  Fact Sheet for Healthcare Providers: SeriousBroker.it  This test is not yet approved or cleared by the [place] FDA and has been authorized for detection and/or diagnosis of SARS-CoV-2 by FDA under an Emergency Use Authorization (EUA). This EUA will remain in effect (meaning this test can be used) for the duration of the COVID-19 declaration under Section 564(b)(1) of the Act, 21 U.S.C. section 360bbb-3(b)(1), unless the authorization is terminated or revoked.  Performed at [facility], [address] [place], [place] [address]   I-stat chem 8, ED (not at [facility] or [facility])     Status: Abnormal   Collection Time: 08/16/21  4:26 AM  Result Value Ref Range   Sodium 140 135 - 145 mmol/L   Potassium 3.5 3.5 - 5.1 mmol/L   Chloride 106 98 - 111 mmol/L   BUN <3 (L) 6 - 20 mg/dL   Creatinine, Ser 6.04 (L) 0.44 - 1.00 mg/dL   Glucose, Bld 540 (H) 70 - 99 mg/dL     Comment: Glucose reference range applies only to samples taken after fasting for at least 8 hours.   Calcium, Ion 1.23 1.15 - 1.40 mmol/L   TCO2 23 22 - 32 mmol/L   Hemoglobin 12.9 12.0 - 15.0 g/dL   HCT 98.1 19.1 - 47.8 %  HIV Antibody (routine testing w rflx)     Status: None   Collection Time: 08/16/21  9:30 AM  Result Value Ref Range   HIV Screen 4th Generation wRfx Non Reactive Non Reactive    Comment: Performed at [facility], [address], [place], [place] [address]  Comprehensive metabolic panel     Status: Abnormal   Collection Time: 08/16/21  9:30 AM  Result Value Ref Range   Sodium 139 135 - 145 mmol/L   Potassium 4.0 3.5 - 5.1 mmol/L   Chloride 110 98 - 111 mmol/L   CO2 22 22 - 32 mmol/L   Glucose, Bld 121 (H) 70 - 99 mg/dL    Comment: Glucose reference range applies only to samples taken after fasting for at least  8 hours.   BUN <5 (L) 6 - 20 mg/dL   Creatinine, Ser 3.01 0.44 - 1.00 mg/dL   Calcium 8.8 (L) 8.9 - 10.3 mg/dL   Total Protein 6.1 (L) 6.5 - 8.1 g/dL   Albumin 3.0 (L) 3.5 - 5.0 g/dL   AST 13 (L) 15 - 41 U/L   ALT 14 0 - 44 U/L   Alkaline Phosphatase 72 38 - 126 U/L   Total Bilirubin 0.2 (L) 0.3 - 1.2 mg/dL   GFR, Estimated >60 >10 mL/min    Comment: (NOTE) Calculated using the CKD-EPI Creatinine Equation (2021)    Anion gap 7 5 - 15    Comment: Performed at [facility], [address] [place], [place] [address]  C4 complement     Status: None   Collection Time: 08/16/21  9:30 AM  Result Value Ref Range   Complement C4, Body Fluid 23 12 - 38 mg/dL    Comment: (NOTE) Performed At: [facility] [address] [place], [place] [id number] Jolene Schimke MD YH:[id number]   Sedimentation rate     Status: Abnormal   Collection Time: 08/16/21  9:30 AM  Result Value Ref Range   Sed Rate 25 (H) 0 - 22 mm/hr    Comment: Performed at [facility], [address], [place], [place] [address]  MRSA Next Gen by PCR, Nasal      Status: None   Collection Time: 08/16/21  3:05 PM   Specimen: Nasal Mucosa; Nasal Swab  Result Value Ref Range   MRSA by PCR Next Gen NOT DETECTED NOT DETECTED    Comment: (NOTE) The GeneXpert MRSA Assay (FDA approved for NASAL specimens only), is one component of a comprehensive MRSA colonization surveillance program. It is not intended to diagnose MRSA infection nor to guide or monitor treatment for MRSA infections. Test performance is not FDA approved in patients less than 25 years old. Performed at [facility], [address] [place], [place] [address]   Glucose, capillary     Status: Abnormal   Collection Time: 08/16/21  3:08 PM  Result Value Ref Range   Glucose-Capillary 113 (H) 70 - 99 mg/dL    Comment: Glucose reference range applies only to samples taken after fasting for at least 8 hours.  C4 complement     Status: None   Collection Time: 08/16/21  4:05 PM  Result Value Ref Range   Complement C4, Body Fluid 23 12 - 38 mg/dL    Comment: (NOTE) Performed At: [facility] [address] [place], [place] [id number] Jolene Schimke MD IO:[id number]   Comprehensive metabolic panel     Status: Abnormal   Collection Time: 08/16/21  4:05 PM  Result Value Ref Range   Sodium 140 135 - 145 mmol/L   Potassium 3.9 3.5 - 5.1 mmol/L   Chloride 109 98 - 111 mmol/L   CO2 21 (L) 22 - 32 mmol/L   Glucose, Bld 106 (H) 70 - 99 mg/dL    Comment: Glucose reference range applies only to samples taken after fasting for at least 8 hours.   BUN <5 (L) 6 - 20 mg/dL   Creatinine, Ser 1.82 0.44 - 1.00 mg/dL   Calcium 8.9 8.9 - 99.3 mg/dL   Total Protein 6.2 (L) 6.5 - 8.1 g/dL   Albumin 3.0 (L) 3.5 - 5.0 g/dL   AST 12 (L) 15 - 41 U/L   ALT 13 0 - 44 U/L   Alkaline  Phosphatase 72 38 - 126 U/L   Total Bilirubin 0.8 0.3 - 1.2 mg/dL   GFR, Estimated >61 >44 mL/min    Comment: (NOTE) Calculated using the CKD-EPI Creatinine Equation (2021)    Anion gap 10 5 - 15    Comment:  Performed at [facility], [address] [place], [place] [address]  Protein / creatinine ratio, urine     Status: Abnormal   Collection Time: 08/16/21  4:17 PM  Result Value Ref Range   Creatinine, Urine 49.45 mg/dL   Total Protein, Urine 10 mg/dL    Comment: NO NORMAL RANGE ESTABLISHED FOR THIS TEST   Protein Creatinine Ratio 0.20 (H) 0.00 - 0.15 mg/mg[Cre]    Comment: Performed at [facility], [address], [place], [place] [address]  Glucose, capillary     Status: None   Collection Time: 08/16/21  8:29 PM  Result Value Ref Range   Glucose-Capillary 79 70 - 99 mg/dL    Comment: Glucose reference range applies only to samples taken after fasting for at least 8 hours.  Fetal fibronectin     Status: None   Collection Time: 08/17/21 12:01 AM  Result Value Ref Range   Fetal Fibronectin NEGATIVE NEGATIVE    Comment: Performed at [facility], [address] [place], [place] [address]  CBC     Status: Abnormal   Collection Time: 08/17/21  2:43 AM  Result Value Ref Range   WBC 13.3 (H) 4.0 - 10.5 K/uL   RBC 3.86 (L) 3.87 - 5.11 MIL/uL   Hemoglobin 11.2 (L) 12.0 - 15.0 g/dL   HCT 32.6 (L) 71.2 - 45.8 %   MCV 90.4 80.0 - 100.0 fL   MCH 29.0 26.0 - 34.0 pg   MCHC 32.1 30.0 - 36.0 g/dL   RDW 09.9 83.3 - 82.5 %   Platelets 178 150 - 400 K/uL   nRBC 0.0 0.0 - 0.2 %    Comment: Performed at [facility], [address], [place], [place] [address]  Basic metabolic panel     Status: Abnormal   Collection Time: 08/17/21  2:43 AM  Result Value Ref Range   Sodium 137 135 - 145 mmol/L   Potassium 3.8 3.5 - 5.1 mmol/L   Chloride 110 98 - 111 mmol/L   CO2 18 (L) 22 - 32 mmol/L   Glucose, Bld 141 (H) 70 - 99 mg/dL    Comment: Glucose reference range applies only to samples taken after fasting for at least 8 hours.   BUN 5 (L) 6 - 20 mg/dL   Creatinine, Ser 6.73 0.44 - 1.00 mg/dL   Calcium 8.6 (L) 8.9 - 10.3 mg/dL   GFR, Estimated >41 >93 mL/min    Comment:  (NOTE) Calculated using the CKD-EPI Creatinine Equation (2021)    Anion gap 9 5 - 15    Comment: Performed at [facility], [address], [place], [place] [address]  Magnesium     Status: None   Collection Time: 08/17/21  2:43 AM  Result Value Ref Range   Magnesium 1.8 1.7 - 2.4 mg/dL    Comment: Performed at [facility], [address] [place], [place] [address]  Phosphorus     Status: None   Collection Time: 08/17/21  2:43 AM  Result Value Ref Range   Phosphorus 4.1 2.5 - 4.6 mg/dL    Comment: Performed at [facility], [address],  [place], [place] [address]    DG Neck Soft Tissue  Addendum Date: 08/16/2021   ADDENDUM REPORT: 08/16/2021 05:16 ADDENDUM: Study discussed by telephone with Dr. Cy Blamer on 08/16/2021 at 0508 hours. She advises the clinical picture is consistent with Epiglottitis, and ENT has been consulted. She is unsure if the patient would tolerate Neck CT at this time. Electronically Signed   By: Odessa Fleming M.D.   On: 08/16/2021 05:16   Result Date: 08/16/2021 CLINICAL DATA:  25 year old female with possible allergic reaction. Uvula swelling. EXAM: NECK SOFT TISSUES - 1+ VIEW COMPARISON:  None. FINDINGS: Hypopharynx is distended with gas, and the epiglottis appears mildly to moderately thickened - although some obliquity of the epiglottis is also suspected on the lateral view. AE folds also appear thickened at the hypopharynx. Tonsillar, uvula and adenoid soft tissue contours appear to remain normal. Normal prevertebral soft tissue contour. Reversal of cervical lordosis. C7 cervical ribs greater on the right (normal variant). No acute osseous abnormality identified. Somewhat low lung volumes, but otherwise negative visible chest. IMPRESSION: Hypopharynx distended with gas and mild to moderately thickened appearance of the epiglottis and AE folds. Other pharyngeal and retropharyngeal contours are normal. Consider acute epiglottitis versus laryngitis.  Neck CT (IV contrast preferred) may be valuable exclude. Electronically Signed: By: Odessa Fleming M.D. On: 08/16/2021 04:58   CT Soft Tissue Neck W Contrast  Result Date: 08/16/2021 CLINICAL DATA:  25 year old female with possible allergic reaction, uvula swelling and radiographic appearance suspicious for acute epiglottitis. EXAM: CT NECK WITH CONTRAST TECHNIQUE: Multidetector CT imaging of the neck was performed using the standard protocol following the bolus administration of intravenous contrast. CONTRAST:  83mL OMNIPAQUE IOHEXOL 300 MG/ML  SOLN COMPARISON:  Neck radiographs 0416 hours today. FINDINGS: Pharynx and larynx: Despite the earlier radiographic appearance the epiglottis is within normal limits (series 3, image 55 and sagittal image 35). Furthermore, the remaining supraglottic larynx appears within normal limits, AE folds do not appear swollen. The glottis appears normal. Remaining hypopharynx contours are within normal limits including the lingual tonsil. The uvula and soft palate do appear indistinct (series 3, image 43 and sagittal image 32), but the other pharyngeal soft tissue contours are within normal limits and there does not appear to be significant airway narrowing. Parapharyngeal and retropharyngeal spaces are within normal limits. Salivary glands: Sublingual space, sublingual glands, submandibular glands, and parotid glands are within normal limits. Thyroid: Negative. Lymph nodes: Negative. Bilateral cervical lymph nodes are within normal limits, fairly symmetric. Vascular: Major vascular structures in the neck and at the skull base are patent, unremarkable (dominant right vertebral artery, normal variant). Limited intracranial: Negative. Visualized orbits: Negative. Mastoids and visualized paranasal sinuses: Trace fluid level in the right maxillary sinus. Other paranasal sinuses and mastoids are well aerated. Tympanic cavities are clear. Skeleton: No acute dental finding. No osseous  abnormality identified. Upper chest: Negative.  Minor upper lung respiratory motion. IMPRESSION: 1. CT demonstrates a normal epiglottis, and no convincing laryngeal edema. The uvula and soft palate do appear indistinct, but there is no significant pharyngitis or airway compromise. 2. Trace fluid level in the right maxillary sinus. Otherwise negative CT appearance of the Neck. Study discussed by telephone with Dr. Cy Blamer on 08/16/2021 at 06:27, who advises that after steroids the patient's symptoms have now substantially regressed. Electronically Signed   By: Odessa Fleming M.D.   On: 08/16/2021 06:30    Current scheduled medications  cefdinir  300 mg Oral Q12H   Chlorhexidine Gluconate Cloth  6 each Topical Daily   heparin  5,000 Units Subcutaneous Q8H   indomethacin  25 mg Oral Q6H   prenatal multivitamin  1 tablet Oral Q1200    I have reviewed the patient's current medications.  ASSESSMENT: Active Problems:   Epiglottitis   PLAN: Pregnancy - Discussed with FFN negative and symptoms resolved, she does not need to continue the indomethacin. We discussed if CTXNs resume, she should return to the [facility]U. If she had ctxns again, I would have low threshold for BMZ but at this time given cervix was closed, FFN negative, and she has no h/o PTB I do not think necessary to intervene.  - Her last growth on 10/7 was normal 32%ile, 2lb13oz and was perfomred for risk of T21 and pt's personal history of Usher's Syndrome. No aneuploidy markers seen on that [place]. She has next [place] on 11/17.  - Obstetrically, her follow up can be 10/31 as planned  Presumed Epiglottis - Per primary team - plan is safe for pregnancy - Discharge as recommended  3. Spanish speaking  - Translation performed by her RN   Milas Hock, MD, FACOG Obstetrician & Gynecologist, [facility]

## 2021-08-19 LAB — TRYPTASE: Tryptase: 3.2 ug/L (ref 2.2–13.2)

## 2021-08-21 ENCOUNTER — Telehealth: Payer: Self-pay

## 2021-08-21 LAB — COMPLEMENT COMPONENT C1Q
C1q Complement Protein CC1Q: 15 mg/dL (ref 10.3–20.5)
C1q Complement Protein CC1Q: 14.6 mg/dL (ref 10.3–20.5)

## 2021-08-21 NOTE — Telephone Encounter (Signed)
Entry in error

## 2021-08-24 LAB — IGE: IgE (Immunoglobulin E), Serum: 104 IU/mL (ref 6–495)

## 2021-09-04 ENCOUNTER — Other Ambulatory Visit: Payer: Self-pay | Admitting: Family Medicine

## 2021-09-04 DIAGNOSIS — Z98891 History of uterine scar from previous surgery: Secondary | ICD-10-CM

## 2021-09-13 ENCOUNTER — Other Ambulatory Visit: Payer: Self-pay

## 2021-09-13 ENCOUNTER — Ambulatory Visit: Payer: Self-pay | Admitting: *Deleted

## 2021-09-13 ENCOUNTER — Ambulatory Visit: Payer: Self-pay | Attending: Obstetrics and Gynecology

## 2021-09-13 VITALS — BP 128/69 | HR 90

## 2021-09-13 DIAGNOSIS — O280 Abnormal hematological finding on antenatal screening of mother: Secondary | ICD-10-CM

## 2021-09-13 DIAGNOSIS — O34219 Maternal care for unspecified type scar from previous cesarean delivery: Secondary | ICD-10-CM

## 2021-10-10 NOTE — H&P (Signed)
OBSTETRIC ADMISSION HISTORY AND PHYSICAL  Jamie Hess is a 25 y.o. female [phone] with IUP at [redacted]w[redacted]d by ultrasound presenting for scheduled repeat cesarean section (history of 1 prior CS and 2 VBACs). She reports +FMs, No LOF, no VB, no blurry vision, headaches or peripheral edema, and RUQ pain.  She plans on breast and bottle feeding. She request BTL for birth control.  She received her prenatal care at  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   Medical history non-contributory     Past Surgical History: Past Surgical History:  Procedure Laterality Date   CESAREAN SECTION     LAPAROSCOPY N/A 05/10/2018   Procedure: LAPAROSCOPY DIAGNOSTIC;  Surgeon: 05/12/2018, MD;  Location: [facility] OR;  Service: General;  Laterality: N/A;   UMBILICAL HERNIA REPAIR N/A 05/10/2018   Procedure: OPEN HERNIA REPAIR UMBILICAL ADULT;  Surgeon: 05/12/2018, MD;  Location: [facility] OR;  Service: General;  Laterality: N/A;    Obstetrical History: OB History     Gravida  4   Para  3   Term  3   Preterm      AB      Living  3      SAB      IAB      Ectopic      Multiple  0   Live Births  2        Obstetric Comments  G1: 08/2011 c-section ? Abruption, 37wks, 3kg         Social History Social History   Socioeconomic History   Marital status: Single    Spouse name: Not on file   Number of children: Not on file   Years of education: Not on file   Highest education level: Not on  file  Occupational History   Not on file  Tobacco Use   Smoking status: Never   Smokeless tobacco: Never  Vaping Use   Vaping Use: Never used  Substance and Sexual Activity   Alcohol use: No   Drug use: No   Sexual activity: Yes    Birth control/protection: None  Other Topics Concern   Not on file  Social History Narrative   Not on file   Social Determinants of Health   Financial Resource Strain: Not on file  Food Insecurity: Not on file  Transportation Needs: Not on file  Physical Activity: Not on file  Stress: Not on file  Social Connections: Not on file    Family History: Family History  Problem Relation Age of Onset   Hearing loss Daughter    Diabetes Mother    Diabetes Paternal Grandmother    Diabetes Paternal Grandfather     Allergies: No Known Allergies  Medications Prior to Admission  Medication Sig Dispense Refill Last Dose   Prenatal Vit-Fe Fumarate-FA (PRENATAL VITAMIN PO) Take 1 tablet by mouth daily after breakfast.   Past Week     Review of Systems   All systems reviewed and negative except as stated in HPI  Blood  pressure 113/67, pulse 76, temperature 97.9 F (36.6 C), resp. rate 16, last menstrual period 01/04/2021, unknown if currently breastfeeding. General appearance: alert Lungs: clear to auscultation bilaterally Heart: regular rate and rhythm Abdomen: soft, non-tender; bowel sounds normal Extremities: Homans sign is negative, no sign of DVT   Prenatal labs: ABO, Rh: --/--/O POS (12/15 1122)O+ Antibody: NEG (12/15 1122)Negative Rubella:  Immune RPR: NON REACTIVE (12/15 1122) non-reactive HBsAg:   non-reactive HIV: NON REACTIVE (12/15 1122)  GBS:   Negative 3 hr Glucola normal  Genetic screening  normal Quad screen, offered genetic counseling  and amnio given family hx of Usher syndrome- declined Anatomy US normal  Prenatal Transfer Tool  Maternal Diabetes: No Genetic Screening: Normal Maternal Ultrasounds/Referrals:  Normal Fetal Ultrasounds or other Referrals:  None Maternal Substance Abuse:  No Significant Maternal Medications:  None Significant Maternal Lab Results: Group B Strep negative  No results found for this or any previous visit (from the past 24 hour(s)).  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 [phone] at [redacted]w[redacted]d here forscheduled repeat CS  Scheduled repeat CS Contraception - undesired fertility The risks of cesarean section were discussed with the patient including but were not limited to: bleeding which may require transfusion or reoperation; infection which may require antibiotics; injury to bowel, bladder, ureters or other surrounding organs; injury to the fetus; need for additional procedures including hysterectomy in the event of a life-threatening hemorrhage; placental abnormalities wth subsequent pregnancies, incisional problems, thromboembolic phenomenon and other postoperative/anesthesia complications.  Patient also desires permanent sterilization.  Other reversible forms of contraception were discussed with patient; she declines all other modalities. Risks of procedure discussed with patient including but not limited to: risk of regret, permanence of method, bleeding, infection, injury to surrounding organs and need for additional procedures.  Failure risk of about 1% with increased risk of ectopic gestation if  pregnancy occurs was also discussed with patient.  Also discussed possibility of post-tubal pain syndrome. The patient concurred with the proposed plan, giving informed written consent for the procedures.  Patient has been NPO since MN she will remain NPO for procedure. Anesthesia and OR aware.  Preoperative prophylactic antibiotics and SCDs ordered on call to the OR.  To OR when ready.   #Pain: Spinal  #ID:  GBS neg, will get preop antibiotics as usual #MOF: breast #MOC:BTL #Circ:  N/A female fetus  Warner Mccreedy, MD  10/12/2021, 1:05 PM

## 2021-10-11 ENCOUNTER — Encounter (HOSPITAL_COMMUNITY)
Admission: RE | Admit: 2021-10-11 | Discharge: 2021-10-11 | Disposition: A | Discharge status: Home / Self Care | Payer: Self-pay | Source: Ambulatory Visit | Attending: Family Medicine | Admitting: Family Medicine

## 2021-10-11 ENCOUNTER — Other Ambulatory Visit: Payer: Self-pay

## 2021-10-11 ENCOUNTER — Other Ambulatory Visit: Payer: Self-pay | Admitting: Family Medicine

## 2021-10-11 DIAGNOSIS — Z01812 Encounter for preprocedural laboratory examination: Secondary | ICD-10-CM | POA: Insufficient documentation

## 2021-10-11 DIAGNOSIS — Z20822 Contact with and (suspected) exposure to covid-19: Secondary | ICD-10-CM | POA: Insufficient documentation

## 2021-10-11 LAB — RAPID HIV SCREEN (HIV 1/2 AB+AG)
HIV 1/2 Antibodies: NONREACTIVE
HIV-1 P24 Antigen - HIV24: NONREACTIVE

## 2021-10-11 LAB — CBC
HCT: 39.6 % (ref 36.0–46.0)
Hemoglobin: 13.5 g/dL (ref 12.0–15.0)
MCH: 29.9 pg (ref 26.0–34.0)
MCHC: 34.1 g/dL (ref 30.0–36.0)
MCV: 87.6 fL (ref 80.0–100.0)
Platelets: 162 10*3/uL (ref 150–400)
RBC: 4.52 MIL/uL (ref 3.87–5.11)
RDW: 14.4 % (ref 11.5–15.5)
WBC: 8.7 10*3/uL (ref 4.0–10.5)
nRBC: 0 % (ref 0.0–0.2)

## 2021-10-11 LAB — TYPE AND SCREEN
ABO/RH(D): O POS
Antibody Screen: NEGATIVE

## 2021-10-11 LAB — SARS CORONAVIRUS 2 (TAT 6-24 HRS): SARS Coronavirus 2: NEGATIVE

## 2021-10-11 NOTE — Patient Instructions (Signed)
Paisli Silfies  10/11/2021   Your procedure is scheduled on:  10/12/2021  Arrive at 10:45 AM at Entrance C on [address] at [facility]  and [employer]. You are invited to use the FREE valet parking or use the Visitor's parking deck.  Pick up the phone at the desk and dial [phone].  Call this number if you have problems the morning of surgery: [phone]  Remember:   Do not eat food:(After Midnight) Desps de medianoche.  Do not drink clear liquids: (After Midnight) Desps de medianoche.  Take these medicines the morning of surgery with A SIP OF WATER:  none   Do not wear jewelry, make-up or nail polish.  Do not wear lotions, powders, or perfumes. Do not wear deodorant.  Do not shave 48 hours prior to surgery.  Do not bring valuables to the hospital.  [facility] is not   responsible for any belongings or valuables brought to the hospital.  Contacts, dentures or bridgework may not be worn into surgery.  Leave suitcase in the car. After surgery it may be brought to your room.  For patients admitted to the hospital, checkout time is 11:00 AM the day of              discharge.      Please read over the following fact sheets that you were given:     Preparing for Surgery

## 2021-10-12 ENCOUNTER — Inpatient Hospital Stay (HOSPITAL_COMMUNITY): Payer: Medicaid Other | Admitting: Anesthesiology

## 2021-10-12 ENCOUNTER — Encounter (HOSPITAL_COMMUNITY)
Admission: RE | Disposition: A | Discharge status: Home / Self Care | Payer: Self-pay | Source: Ambulatory Visit | Attending: Family Medicine

## 2021-10-12 ENCOUNTER — Encounter (HOSPITAL_COMMUNITY): Payer: Self-pay | Admitting: Obstetrics & Gynecology

## 2021-10-12 ENCOUNTER — Inpatient Hospital Stay (HOSPITAL_COMMUNITY)
Admission: RE | Admit: 2021-10-12 | Discharge: 2021-10-14 | DRG: 785 | Disposition: A | Discharge status: Home / Self Care | Payer: Medicaid Other | Source: Ambulatory Visit | Attending: Family Medicine | Admitting: Family Medicine

## 2021-10-12 ENCOUNTER — Other Ambulatory Visit: Payer: Self-pay

## 2021-10-12 DIAGNOSIS — O34211 Maternal care for low transverse scar from previous cesarean delivery: Secondary | ICD-10-CM | POA: Diagnosis present

## 2021-10-12 DIAGNOSIS — Z302 Encounter for sterilization: Secondary | ICD-10-CM

## 2021-10-12 DIAGNOSIS — Z98891 History of uterine scar from previous surgery: Secondary | ICD-10-CM

## 2021-10-12 DIAGNOSIS — Z3490 Encounter for supervision of normal pregnancy, unspecified, unspecified trimester: Secondary | ICD-10-CM

## 2021-10-12 DIAGNOSIS — Z603 Acculturation difficulty: Secondary | ICD-10-CM | POA: Diagnosis present

## 2021-10-12 DIAGNOSIS — Z3A39 39 weeks gestation of pregnancy: Secondary | ICD-10-CM

## 2021-10-12 DIAGNOSIS — Z789 Other specified health status: Secondary | ICD-10-CM | POA: Diagnosis present

## 2021-10-12 DIAGNOSIS — Z87441 Personal history of nephrotic syndrome: Secondary | ICD-10-CM | POA: Diagnosis not present

## 2021-10-12 DIAGNOSIS — Z9851 Tubal ligation status: Secondary | ICD-10-CM

## 2021-10-12 LAB — RPR: RPR Ser Ql: NONREACTIVE

## 2021-10-12 SURGERY — Surgical Case
Anesthesia: Spinal | Laterality: Bilateral

## 2021-10-12 MED ORDER — FENTANYL CITRATE (PF) 100 MCG/2ML IJ SOLN
INTRAMUSCULAR | Status: AC
  Filled 2021-10-12: qty 2

## 2021-10-12 MED ORDER — DEXAMETHASONE SODIUM PHOSPHATE 4 MG/ML IJ SOLN
INTRAMUSCULAR | Status: DC | PRN
Start: 2021-10-12 — End: 2021-10-12
  Administered 2021-10-12: 10 mg via INTRAVENOUS

## 2021-10-12 MED ORDER — SOD CITRATE-CITRIC ACID 500-334 MG/5ML PO SOLN
ORAL | Status: AC
  Filled 2021-10-12: qty 30

## 2021-10-12 MED ORDER — PRENATAL MULTIVITAMIN CH
1.0000 | ORAL_TABLET | Freq: Every day | ORAL | Status: DC
  Administered 2021-10-13 – 2021-10-14 (×2): 1 via ORAL
  Filled 2021-10-12 (×2): qty 1

## 2021-10-12 MED ORDER — KETOROLAC TROMETHAMINE 30 MG/ML IJ SOLN: 30.0000 mg | Freq: Four times a day (QID) | INTRAMUSCULAR | Status: AC | PRN

## 2021-10-12 MED ORDER — NALOXONE HCL 0.4 MG/ML IJ SOLN: 0.4000 mg | INTRAMUSCULAR | Status: DC | PRN

## 2021-10-12 MED ORDER — CEFAZOLIN SODIUM-DEXTROSE 2-4 GM/100ML-% IV SOLN
2.0000 g | INTRAVENOUS | Status: AC
  Administered 2021-10-12: 2 g via INTRAVENOUS

## 2021-10-12 MED ORDER — SOD CITRATE-CITRIC ACID 500-334 MG/5ML PO SOLN
30.0000 mL | ORAL | Status: AC
  Administered 2021-10-12: 30 mL via ORAL

## 2021-10-12 MED ORDER — PHENYLEPHRINE HCL-NACL 20-0.9 MG/250ML-% IV SOLN
INTRAVENOUS | Status: AC
  Filled 2021-10-12: qty 250

## 2021-10-12 MED ORDER — DIBUCAINE (PERIANAL) 1 % EX OINT: 1.0000 "application " | TOPICAL_OINTMENT | CUTANEOUS | Status: DC | PRN

## 2021-10-12 MED ORDER — OXYTOCIN-SODIUM CHLORIDE 30-0.9 UT/500ML-% IV SOLN
INTRAVENOUS | Status: AC
  Filled 2021-10-12: qty 500

## 2021-10-12 MED ORDER — DIPHENHYDRAMINE HCL 50 MG/ML IJ SOLN
INTRAMUSCULAR | Status: AC
  Filled 2021-10-12: qty 1

## 2021-10-12 MED ORDER — TETANUS-DIPHTH-ACELL PERTUSSIS 5-2.5-18.5 LF-MCG/0.5 IM SUSY: 0.5000 mL | PREFILLED_SYRINGE | Freq: Once | INTRAMUSCULAR | Status: DC

## 2021-10-12 MED ORDER — IBUPROFEN 600 MG PO TABS
600.0000 mg | ORAL_TABLET | Freq: Four times a day (QID) | ORAL | Status: DC
  Administered 2021-10-13 – 2021-10-14 (×5): 600 mg via ORAL
  Filled 2021-10-12 (×5): qty 1

## 2021-10-12 MED ORDER — PROMETHAZINE HCL 25 MG/ML IJ SOLN: 6.2500 mg | INTRAMUSCULAR | Status: DC | PRN

## 2021-10-12 MED ORDER — NALOXONE HCL 4 MG/10ML IJ SOLN
1.0000 ug/kg/h | INTRAVENOUS | Status: DC | PRN
  Filled 2021-10-12: qty 5

## 2021-10-12 MED ORDER — ENOXAPARIN SODIUM 40 MG/0.4ML IJ SOSY
40.0000 mg | PREFILLED_SYRINGE | INTRAMUSCULAR | Status: DC
  Administered 2021-10-13 – 2021-10-14 (×2): 40 mg via SUBCUTANEOUS
  Filled 2021-10-12 (×2): qty 0.4

## 2021-10-12 MED ORDER — ACETAMINOPHEN 500 MG PO TABS
ORAL_TABLET | ORAL | Status: AC
  Filled 2021-10-12: qty 2

## 2021-10-12 MED ORDER — ACETAMINOPHEN 500 MG PO TABS: 1000.0000 mg | ORAL_TABLET | Freq: Four times a day (QID) | ORAL | Status: DC

## 2021-10-12 MED ORDER — WITCH HAZEL-GLYCERIN EX PADS: 1.0000 "application " | MEDICATED_PAD | CUTANEOUS | Status: DC | PRN

## 2021-10-12 MED ORDER — DIPHENHYDRAMINE HCL 25 MG PO CAPS: 25.0000 mg | ORAL_CAPSULE | ORAL | Status: DC | PRN

## 2021-10-12 MED ORDER — FENTANYL CITRATE (PF) 100 MCG/2ML IJ SOLN: 25.0000 ug | INTRAMUSCULAR | Status: DC | PRN

## 2021-10-12 MED ORDER — ONDANSETRON HCL 4 MG/2ML IJ SOLN
INTRAMUSCULAR | Status: AC
  Filled 2021-10-12: qty 2

## 2021-10-12 MED ORDER — KETOROLAC TROMETHAMINE 30 MG/ML IJ SOLN
30.0000 mg | Freq: Once | INTRAMUSCULAR | Status: AC
  Administered 2021-10-12: 30 mg via INTRAVENOUS

## 2021-10-12 MED ORDER — SIMETHICONE 80 MG PO CHEW
80.0000 mg | CHEWABLE_TABLET | Freq: Three times a day (TID) | ORAL | Status: DC
  Administered 2021-10-13 – 2021-10-14 (×4): 80 mg via ORAL
  Filled 2021-10-12 (×4): qty 1

## 2021-10-12 MED ORDER — KETOROLAC TROMETHAMINE 30 MG/ML IJ SOLN
INTRAMUSCULAR | Status: AC
  Filled 2021-10-12: qty 1

## 2021-10-12 MED ORDER — OXYTOCIN-SODIUM CHLORIDE 30-0.9 UT/500ML-% IV SOLN: 2.5000 [IU]/h | INTRAVENOUS | Status: AC

## 2021-10-12 MED ORDER — DIPHENHYDRAMINE HCL 25 MG PO CAPS: 25.0000 mg | ORAL_CAPSULE | Freq: Four times a day (QID) | ORAL | Status: DC | PRN

## 2021-10-12 MED ORDER — SIMETHICONE 80 MG PO CHEW: 80.0000 mg | CHEWABLE_TABLET | ORAL | Status: DC | PRN

## 2021-10-12 MED ORDER — ACETAMINOPHEN 500 MG PO TABS
1000.0000 mg | ORAL_TABLET | Freq: Four times a day (QID) | ORAL | Status: AC
  Administered 2021-10-12 – 2021-10-13 (×4): 1000 mg via ORAL
  Filled 2021-10-12 (×4): qty 2

## 2021-10-12 MED ORDER — KETOROLAC TROMETHAMINE 30 MG/ML IJ SOLN
30.0000 mg | Freq: Four times a day (QID) | INTRAMUSCULAR | Status: AC | PRN
  Administered 2021-10-12 – 2021-10-13 (×2): 30 mg via INTRAVENOUS
  Filled 2021-10-12 (×2): qty 1

## 2021-10-12 MED ORDER — ONDANSETRON HCL 4 MG/2ML IJ SOLN
INTRAMUSCULAR | Status: DC | PRN
  Administered 2021-10-12: 4 mg via INTRAVENOUS

## 2021-10-12 MED ORDER — PHENYLEPHRINE HCL-NACL 20-0.9 MG/250ML-% IV SOLN
INTRAVENOUS | Status: DC | PRN
Start: 2021-10-12 — End: 2021-10-12
  Administered 2021-10-12: 60 ug/min via INTRAVENOUS

## 2021-10-12 MED ORDER — MORPHINE SULFATE (PF) 0.5 MG/ML IJ SOLN
INTRAMUSCULAR | Status: DC | PRN
  Administered 2021-10-12: 150 ug via INTRATHECAL

## 2021-10-12 MED ORDER — ZOLPIDEM TARTRATE 5 MG PO TABS: 5.0000 mg | ORAL_TABLET | Freq: Every evening | ORAL | Status: DC | PRN

## 2021-10-12 MED ORDER — LACTATED RINGERS IV SOLN: INTRAVENOUS | Status: DC

## 2021-10-12 MED ORDER — ONDANSETRON HCL 4 MG/2ML IJ SOLN: 4.0000 mg | Freq: Three times a day (TID) | INTRAMUSCULAR | Status: DC | PRN

## 2021-10-12 MED ORDER — FENTANYL CITRATE (PF) 100 MCG/2ML IJ SOLN
INTRAMUSCULAR | Status: DC | PRN
  Administered 2021-10-12: 15 ug via INTRATHECAL

## 2021-10-12 MED ORDER — CEFAZOLIN SODIUM-DEXTROSE 2-4 GM/100ML-% IV SOLN
INTRAVENOUS | Status: AC
  Filled 2021-10-12: qty 100

## 2021-10-12 MED ORDER — MENTHOL 3 MG MT LOZG: 1.0000 | LOZENGE | OROMUCOSAL | Status: DC | PRN

## 2021-10-12 MED ORDER — SODIUM CHLORIDE 0.9% FLUSH: 3.0000 mL | INTRAVENOUS | Status: DC | PRN

## 2021-10-12 MED ORDER — DIPHENHYDRAMINE HCL 50 MG/ML IJ SOLN
12.5000 mg | INTRAMUSCULAR | Status: DC | PRN
  Administered 2021-10-12: 12.5 mg via INTRAVENOUS

## 2021-10-12 MED ORDER — NALBUPHINE HCL 10 MG/ML IJ SOLN
INTRAMUSCULAR | Status: AC
  Filled 2021-10-12: qty 1

## 2021-10-12 MED ORDER — SENNOSIDES-DOCUSATE SODIUM 8.6-50 MG PO TABS
2.0000 | ORAL_TABLET | Freq: Every day | ORAL | Status: DC
  Administered 2021-10-13 – 2021-10-14 (×2): 2 via ORAL
  Filled 2021-10-12 (×2): qty 2

## 2021-10-12 MED ORDER — BUPIVACAINE IN DEXTROSE 0.75-8.25 % IT SOLN
INTRATHECAL | Status: DC | PRN
  Administered 2021-10-12: 1.6 mL via INTRATHECAL

## 2021-10-12 MED ORDER — OXYCODONE HCL 5 MG PO TABS
5.0000 mg | ORAL_TABLET | Freq: Four times a day (QID) | ORAL | Status: DC | PRN
  Administered 2021-10-14 (×2): 5 mg via ORAL
  Filled 2021-10-12 (×2): qty 1

## 2021-10-12 MED ORDER — MORPHINE SULFATE (PF) 0.5 MG/ML IJ SOLN
INTRAMUSCULAR | Status: AC
  Filled 2021-10-12: qty 10

## 2021-10-12 MED ORDER — ACETAMINOPHEN 500 MG PO TABS
1000.0000 mg | ORAL_TABLET | Freq: Once | ORAL | Status: AC
  Administered 2021-10-12: 1000 mg via ORAL

## 2021-10-12 MED ORDER — COCONUT OIL OIL: 1.0000 "application " | TOPICAL_OIL | Status: DC | PRN

## 2021-10-12 MED ORDER — OXYTOCIN-SODIUM CHLORIDE 30-0.9 UT/500ML-% IV SOLN
INTRAVENOUS | Status: DC | PRN
Start: 2021-10-12 — End: 2021-10-12
  Administered 2021-10-12: 30 [IU] via INTRAVENOUS

## 2021-10-12 MED ORDER — ACETAMINOPHEN 160 MG/5ML PO SOLN: 1000.0000 mg | Freq: Once | ORAL | Status: AC

## 2021-10-12 SURGICAL SUPPLY — 33 items
BARRIER ADHS 3X4 INTERCEED (GAUZE/BANDAGES/DRESSINGS) IMPLANT
BENZOIN TINCTURE PRP APPL 2/3 (GAUZE/BANDAGES/DRESSINGS) ×1 IMPLANT
CLAMP CORD UMBIL (MISCELLANEOUS) IMPLANT
CLIP FILSHIE TUBAL LIGA STRL (Clip) IMPLANT
CLOTH BEACON ORANGE TIMEOUT ST (SAFETY) ×2 IMPLANT
DRSG OPSITE POSTOP 4X10 (GAUZE/BANDAGES/DRESSINGS) ×2 IMPLANT
ELECT REM PT RETURN 9FT ADLT (ELECTROSURGICAL) ×2
ELECTRODE REM PT RTRN 9FT ADLT (ELECTROSURGICAL) ×1 IMPLANT
EXTRACTOR VACUUM KIWI (MISCELLANEOUS) IMPLANT
GLOVE BIO SURGEON STRL SZ 6.5 (GLOVE) ×2 IMPLANT
GLOVE BIOGEL PI IND STRL 7.0 (GLOVE) ×2 IMPLANT
GLOVE BIOGEL PI INDICATOR 7.0 (GLOVE) ×2
GOWN STRL REUS W/TWL LRG LVL3 (GOWN DISPOSABLE) ×4 IMPLANT
KIT ABG SYR 3ML LUER SLIP (SYRINGE) IMPLANT
NDL HYPO 25X5/8 SAFETYGLIDE (NEEDLE) IMPLANT
NEEDLE HYPO 22GX1.5 SAFETY (NEEDLE) IMPLANT
NEEDLE HYPO 25X5/8 SAFETYGLIDE (NEEDLE) IMPLANT
NS IRRIG 1000ML POUR BTL (IV SOLUTION) ×2 IMPLANT
PACK C SECTION WH (CUSTOM PROCEDURE TRAY) ×2 IMPLANT
PAD OB MATERNITY 4.3X12.25 (PERSONAL CARE ITEMS) ×2 IMPLANT
PENCIL SMOKE EVAC W/HOLSTER (ELECTROSURGICAL) ×2 IMPLANT
RTRCTR C-SECT PINK 25CM LRG (MISCELLANEOUS) IMPLANT
STRIP CLOSURE SKIN 1/2X4 (GAUZE/BANDAGES/DRESSINGS) ×1 IMPLANT
SUT PLAIN 2 0 (SUTURE) ×1
SUT PLAIN ABS 2-0 CT1 27XMFL (SUTURE) IMPLANT
SUT VIC AB 0 CT1 36 (SUTURE) ×12 IMPLANT
SUT VIC AB 2-0 CT1 27 (SUTURE) ×1
SUT VIC AB 2-0 CT1 TAPERPNT 27 (SUTURE) ×1 IMPLANT
SUT VIC AB 4-0 PS2 27 (SUTURE) ×2 IMPLANT
SYR CONTROL 10ML LL (SYRINGE) IMPLANT
TOWEL OR 17X24 6PK STRL BLUE (TOWEL DISPOSABLE) ×2 IMPLANT
TRAY FOLEY W/BAG SLVR 14FR LF (SET/KITS/TRAYS/PACK) IMPLANT
WATER STERILE IRR 1000ML POUR (IV SOLUTION) ×2 IMPLANT

## 2021-10-12 NOTE — Anesthesia Preprocedure Evaluation (Addendum)
Anesthesia Evaluation  Patient identified by MRN, date of birth, ID band Patient awake    Reviewed: Allergy & Precautions, NPO status , Patient's Chart, lab work & pertinent test results  History of Anesthesia Complications Negative for: history of anesthetic complications  Airway Mallampati: II  TM Distance: >3 FB Neck ROM: Full    Dental no notable dental hx.    Pulmonary neg pulmonary ROS,    Pulmonary exam normal        Cardiovascular negative cardio ROS Normal cardiovascular exam     Neuro/Psych negative neurological ROS  negative psych ROS   GI/Hepatic negative GI ROS, Neg liver ROS,   Endo/Other  negative endocrine ROS  Renal/GU negative Renal ROS  negative genitourinary   Musculoskeletal negative musculoskeletal ROS (+)   Abdominal   Peds  Hematology negative hematology ROS (+)   Anesthesia Other Findings Day of surgery medications reviewed with patient.  Reproductive/Obstetrics (+) Pregnancy (Hx of C/S x1)                            Anesthesia Physical Anesthesia Plan  ASA: 2  Anesthesia Plan: Spinal   Post-op Pain Management:    Induction:   PONV Risk Score and Plan: 4 or greater and Treatment may vary due to age or medical condition, Ondansetron and Dexamethasone  Airway Management Planned: Natural Airway  Additional Equipment: None  Intra-op Plan:   Post-operative Plan:   Informed Consent: I have reviewed the patients History and Physical, chart, labs and discussed the procedure including the risks, benefits and alternatives for the proposed anesthesia with the patient or authorized representative who has indicated his/her understanding and acceptance.     Interpreter used for [employer] Discussed with: CRNA  Anesthesia Plan Comments:        Anesthesia Quick Evaluation

## 2021-10-12 NOTE — Anesthesia Procedure Notes (Signed)
Spinal  Patient location during procedure: OR Start time: 10/12/2021 1:40 PM End time: 10/12/2021 1:43 PM Reason for block: surgical anesthesia Staffing Performed: anesthesiologist  Anesthesiologist: Kaylyn Layer, MD Preanesthetic Checklist Completed: patient identified, IV checked, risks and benefits discussed, monitors and equipment checked, pre-op evaluation and timeout performed Spinal Block Patient position: sitting Prep: DuraPrep and site prepped and draped Patient monitoring: heart rate, continuous pulse ox and blood pressure Approach: midline Location: L3-4 Injection technique: single-shot Needle Needle type: Pencan  Needle gauge: 24 G Needle length: 10 cm Assessment Sensory level: T4 Events: CSF return Additional Notes Risks, benefits, and alternative discussed. Patient gave consent to procedure. Prepped and draped in sitting position. Clear CSF obtained after one needle pass. Positive terminal aspiration. No pain or paraesthesias with injection. Patient tolerated procedure well. Vital signs stable. Amalia Greenhouse, MD

## 2021-10-12 NOTE — Lactation Note (Signed)
This note was copied from a baby's chart. Lactation Consultation Note  Patient Name: Jamie Hess [id number] Date: 10/12/2021 Reason for consult: Initial assessment;Term;Other (Comment) (Usher syndrome type 1) Age:25 years  Visited with mom of 5 hours FT female, she's a P4 and experienced BF. LC assisted with hand expression and pumping, mom requested a hand pump for home use (she doesn't plan on pumping here at the hospital).   Reviewed normal newborn behavior, feeding cues, cluster feeding, supplementation and storage guidelines for both breastmilk and formula. Mom voiced she didn't need latch assistance at this time, baby already had a bottle and wasn't due for another feeding. Asked mom to call for assistance when needed.  Maternal Data Has patient been taught Hand Expression?: Yes Does the patient have breastfeeding experience prior to this delivery?: Yes How long did the patient breastfeed?: baby # 1 and 2 BF for 3 months, BF # 3 for 12 months  Feeding Mother's Current Feeding Choice: Breast Milk and Formula  Lactation Tools Discussed/Used Tools: Pump;Flanges Flange Size: 24 Breast pump type: Manual Pump Education: Setup, frequency, and cleaning;Milk Storage Reason for Pumping: mother's request Pumping frequency: as needed  Interventions Interventions: Breast feeding basics reviewed;Breast massage;Hand express;Hand pump;Education;LC Services brochure  Plan of care  Encouraged mom to feed baby STS 8-12 times/24 hours or sooner if feeding cues are present She was also encouraged to pump whenever baby is getting formula, she plans of doing it at home though once her milk comes in  No other support person at this time; LC also assisted mom with diaper change and filling out feeding diary. All questions and concerns answered, mom to call LC PRN.  Discharge Pump: Manual WIC Program: Yes  Consult Status Consult Status: Follow-up Date: 10/13/21 Follow-up type:  In-patient   Mikai Meints Venetia Constable 10/12/2021, 7:49 PM

## 2021-10-12 NOTE — Progress Notes (Signed)
Interpreter in to go over all plan of care ,papers and safety. Patient told not to get up and call bell explained.

## 2021-10-12 NOTE — Anesthesia Postprocedure Evaluation (Signed)
Anesthesia Post Note  Patient: Jamie Hess  Procedure(s) Performed: CESAREAN SECTION WITH BILATERAL TUBAL LIGATION (Bilateral)     Patient location during evaluation: PACU Anesthesia Type: Spinal Level of consciousness: awake and alert and oriented Pain management: pain level controlled Vital Signs Assessment: post-procedure vital signs reviewed and stable Respiratory status: spontaneous breathing, nonlabored ventilation and respiratory function stable Cardiovascular status: blood pressure returned to baseline Postop Assessment: no apparent nausea or vomiting, spinal receding, no headache and no backache Anesthetic complications: no   No notable events documented.  Last Vitals:  Vitals:   10/12/21 1600 10/12/21 1615  BP: 110/67 108/66  Pulse: 78 71  Resp: (!) 22 18  Temp: 36.5 C   SpO2: 100% 100%    Last Pain:  Vitals:   10/12/21 1600  TempSrc: Axillary  PainSc: 0-No pain   Pain Goal:                   Shanda Howells

## 2021-10-12 NOTE — Transfer of Care (Signed)
Immediate Anesthesia Transfer of Care Note  Patient: Jamie Hess  Procedure(s) Performed: CESAREAN SECTION WITH BILATERAL TUBAL LIGATION (Bilateral)  Patient Location: PACU  Anesthesia Type:Spinal  Level of Consciousness: awake, alert  and oriented  Airway & Oxygen Therapy: Patient Spontanous Breathing  Post-op Assessment: Report given to RN and Post -op Vital signs reviewed and stable  Post vital signs: Reviewed and stable  Last Vitals:  Vitals Value Taken Time  BP 104/57 10/12/21 1515  Temp    Pulse 79 10/12/21 1516  Resp 18 10/12/21 1516  SpO2 100 % 10/12/21 1516  Vitals shown include unvalidated device data.  Last Pain: There were no vitals filed for this visit.       Complications: No notable events documented.

## 2021-10-12 NOTE — Op Note (Signed)
Operative Note   Patient: Jamie Hess  Date of Procedure: 10/12/2021  Procedure:  Repeat Cesarean section, low transverse - previous vertical skin incision    Indications:  scheduled repeat cesarean  Pre-operative Diagnosis: Elective Repeat Cesarean Section with Bilateral Tubal Ligation.   Post-operative Diagnosis: Same  TOLAC Candidate: No  Surgeon: Surgeon(s) and Role:    * Adam Phenix, MD - Primary    * Warner Mccreedy, MD - Assisting   An experienced assistant was required given the standard of surgical care given the complexity of the case.  This assistant was needed for exposure, dissection, suctioning, retraction, instrument exchange, assisting with delivery with administration of fundal pressure, and for overall help during the procedure.   Anesthesia: spinal  Anesthesiologist: Kaylyn Layer, MD   Antibiotics: Cefazolin   Estimated Blood Loss: 607 ml   Total IV Fluids: 2300 ml  Urine Output:  200 cc OF clear urine  Specimens: bilateral tube segments   Complications: no complications   Indications: Jamie Hess is a 25 y.o. [phone] with an IUP [redacted]w[redacted]d presenting for scheduled cesarean secondary to the indications listed above.   The risks of cesarean section were discussed with the patient including but were not limited to: bleeding which may require transfusion or reoperation; infection which may require antibiotics; injury to bowel, bladder, ureters or other surrounding organs; injury to the fetus; need for additional procedures including hysterectomy in the event of a life-threatening hemorrhage; placental abnormalities wth subsequent pregnancies, incisional problems, thromboembolic phenomenon and other postoperative/anesthesia complications.  Patient also desires permanent sterilization.  Other reversible forms of contraception were discussed with patient; she declines all other modalities. Risks of procedure discussed with patient including but  not limited to: risk of regret, permanence of method, bleeding, infection, injury to surrounding organs and need for additional procedures.  Failure risk of about 1% with increased risk of ectopic gestation if pregnancy occurs was also discussed with patient.  Also discussed possibility of post-tubal pain syndrome. The patient concurred with the proposed plan, giving informed written consent for the procedures.  Patient has been NPO since last night she will remain NPO for procedure. Anesthesia and OR aware.  Preoperative prophylactic antibiotics and SCDs ordered on call to the OR.   Findings: Viable infant in cephalic presentation, nuchal x1. Apgars 8 , 9 , . Weight 3810 g . Clear amniotic fluid. Normal placenta, three vessel cord. Normal uterus, Normal bilateral fallopian tubes, Normal bilateral ovaries.  Procedure Details: A Time Out was held and the above information confirmed. The patient received intravenous antibiotics and had sequential compression devices applied to her lower extremities preoperatively. The patient was taken back to the operative suite where spinal anesthesia was administered. After induction of anesthesia, the patient was draped and prepped in the usual sterile manner and placed in a dorsal supine position with a leftward tilt. A low transverse skin incision was made with scalpel and carried down through the subcutaneous tissue to the fascia. Her prior skin incision was found to be a vertical skin incision which was NOT used for this surgery. Fascial incision was made and extended transversely. The fascia was separated from the underlying rectus tissue superiorly and inferiorly. The rectus muscles were separated in the midline bluntly and the peritoneum was entered bluntly. An Alexis retractor was placed to aid in visualization of the uterus. A bladder flap was not developed. A low transverse uterine incision was made. The infant was successfully delivered from cephalic presentation,  the  umbilical cord was clamped after 1 minute. Cord ph was not sent, and cord blood was obtained for evaluation. The placenta was removed Intact and appeared normal. The uterine incision was closed with running locked sutures of 0-Vicryl, and then a second imbricating layer was also placed with 0-Vicryl. Overall, excellent hemostasis was noted.   Attention was then turned to the fallopian tubes. [place]:The right Fallopian tube was palpated and then traced to it's fimbriae and an avascular midsection of the tube approximately 3-4cm from the cornua was grasped with the Babcock clamps and brought into a knuckle. The tube was double ligated with 3-0 plain gut suture and the intervening portion of tube was transected and removed. Excellent hemostasis was noted.  Attention was then turned to the left fallopian tube, after confirmation of identification by tracing the tube out to the fimbriae. The same procedure was then performed on the left Fallopian tube with excellent hemostasis noted.  The abdomen and the pelvis were cleared of all clot and debris and the Jon Gills was removed. Hemostasis was confirmed on all surfaces.  The peritoneum was reapproximated using 2-0 vicryl . The fascia was then closed using 0 Vicryl in a running fashion. The subcutaneous layer was reapproximated with plain gut and the skin was closed with a 4-0 vicryl subcuticular stitch. The patient tolerated the procedure well. Sponge, lap, instrument and needle counts were correct x 2. She was taken to the recovery room in stable condition.  Disposition: PACU - hemodynamically stable.    Signed:  Warner Mccreedy, MD, MPH OB Fellow, Faculty Practice

## 2021-10-12 NOTE — Discharge Summary (Signed)
Postpartum Discharge Summary  Date of Service updated 10/14/21     Patient Name: Jamie Hess DOB: 1996/10/25 MRN: [id number]  Date of admission: 10/12/2021 Delivery date:10/12/2021  Delivering provider: Renard Matter  Date of discharge: 10/14/2021  Admitting diagnosis: S/P repeat low transverse C-section [Z98.891] Intrauterine pregnancy: [redacted]w[redacted]d    Secondary diagnosis:  Principal Problem:   S/P repeat low transverse C-section Active Problems:   Language barrier   Status post repeat low transverse cesarean section   Status post tubal ligation   Supervision of low-risk pregnancy  Additional problems: None    Discharge diagnosis: Term Pregnancy Delivered                                              Post partum procedures: none Augmentation: N/A Complications: None  Hospital course: Sceduled C/S   25y.o. yo G4P4004 at 364w0das admitted to the hospital 10/12/2021 for scheduled cesarean section with the following indication:Elective Repeat.Delivery details are as follows:  Membrane Rupture Time/Date: 2:13 PM ,10/12/2021   Delivery Method:C-Section, Low Transverse  Details of operation can be found in separate operative note.  Patient had an uncomplicated postpartum course.  She is ambulating, tolerating a regular diet, passing flatus, and urinating well. Patient is discharged home in stable condition on  10/14/21        Newborn Data: Birth date:10/12/2021  Birth time:2:13 PM  Gender:Female  Living status:Living  Apgars:8 ,9  Weight:3810 g     Magnesium Sulfate received: No BMZ received: No Rhophylac:N/A MMR:N/A T-DaP:Given prenatally Flu: No Transfusion:No  Physical exam  Vitals:   10/13/21 0025 10/13/21 0523 10/13/21 2200 10/14/21 0514  BP: (!) 95/54 (!) 108/51 115/64 113/60  Pulse: 75 70 84 72  Resp:  _0 Temp:  98.4 F (36.9 C) (!) 97.5 F (36.4 C) 98 F (36.7 C)  TempSrc:  Oral Oral Oral  SpO2:  97% 99% 99%  Weight:       General: alert,  cooperative, and no distress Lochia: appropriate Uterine Fundus: firm Incision: Healing well with no significant drainage DVT Evaluation: No evidence of DVT seen on physical exam. Labs: Lab Results  Component Value Date   WBC 13.9 (H) 10/13/2021   HGB 11.1 (L) 10/13/2021   HCT 34.1 (L) 10/13/2021   MCV 89.0 10/13/2021   PLT 157 10/13/2021   CMP Latest Ref Rng & Units 08/17/2021  Glucose 70 - 99 mg/dL 141(H)  BUN 6 - 20 mg/dL 5(L)  Creatinine 0.44 - 1.00 mg/dL 0.58  Sodium 135 - 145 mmol/L 137  Potassium 3.5 - 5.1 mmol/L 3.8  Chloride 98 - 111 mmol/L 110  CO2 22 - 32 mmol/L 18(L)  Calcium 8.9 - 10.3 mg/dL 8.6(L)  Total Protein 6.5 - 8.1 g/dL -  Total Bilirubin 0.3 - 1.2 mg/dL -  Alkaline Phos 38 - 126 U/L -  AST 15 - 41 U/L -  ALT 0 - 44 U/L -   Edinburgh Score: Edinburgh Postnatal Depression Scale Screening Tool 10/13/2021  I have been able to laugh and see the funny side of things. 1  I have looked forward with enjoyment to things. 1  I have blamed myself unnecessarily when things went wrong. 1  I have been anxious or worried for no good reason. 0  I have felt scared or panicky for no good reason.  0  Things have been getting on top of me. 1  I have been so unhappy that I have had difficulty sleeping. 1  I have felt sad or miserable. 1  I have been so unhappy that I have been crying. 1  The thought of harming myself has occurred to me. 0  Edinburgh Postnatal Depression Scale Total 7     After visit meds:  Allergies as of 10/14/2021   No Known Allergies      Medication List     TAKE these medications    ibuprofen 600 MG tablet Commonly known as: ADVIL Take 1 tablet (600 mg total) by mouth every 6 (six) hours.   oxyCODONE 5 MG immediate release tablet Commonly known as: Oxy IR/ROXICODONE Take 1-2 tablets (5-10 mg total) by mouth every 6 (six) hours as needed for severe pain.   PRENATAL VITAMIN PO Take 1 tablet by mouth daily after breakfast.          Discharge home in stable condition Infant Feeding: Breast and bottle Infant Disposition:home with mother Discharge instruction: per After Visit Summary and Postpartum booklet. Activity: Advance as tolerated. Pelvic rest for 6 weeks.  Diet: routine diet Future Appointments:No future appointments. Follow up Visit:  Follow-up Information     Department, [facility] Follow up in 6 week(s).   Why: Call the office to make your appointment. Contact information: [address] [place] [address] [phone]                Patient to call and make follow up appt at [facility] for 6 wk F/up Incision check appt message sent to [facility] by Dr. Cy Blamer  Please schedule this patient for a In person postpartum visit in 6 weeks with the following provider: Any provider. Additional Postpartum F/U:Incision check 1 week  Low risk pregnancy complicated by:  None Delivery mode:  C-Section, Low Transverse  Anticipated Birth Control:  BTL done [facility]   10/14/2021 Fatima Blank, CNM

## 2021-10-12 NOTE — Progress Notes (Signed)
Jamie Hess, In-house [employer] used for assessment, plan of care, and for needs expressed at 2015 & 2220.

## 2021-10-13 LAB — CBC
HCT: 34.1 % — ABNORMAL LOW (ref 36.0–46.0)
Hemoglobin: 11.1 g/dL — ABNORMAL LOW (ref 12.0–15.0)
MCH: 29 pg (ref 26.0–34.0)
MCHC: 32.6 g/dL (ref 30.0–36.0)
MCV: 89 fL (ref 80.0–100.0)
Platelets: 157 10*3/uL (ref 150–400)
RBC: 3.83 MIL/uL — ABNORMAL LOW (ref 3.87–5.11)
RDW: 14.4 % (ref 11.5–15.5)
WBC: 13.9 10*3/uL — ABNORMAL HIGH (ref 4.0–10.5)
nRBC: 0 % (ref 0.0–0.2)

## 2021-10-13 NOTE — Progress Notes (Signed)
Ipad interpreter # [id number] used for baby & Mom assessment.

## 2021-10-13 NOTE — Progress Notes (Signed)
POSTPARTUM PROGRESS NOTE  POD #1  Subjective: **Spanish interpreter used for the entire visit Jamie Hess is a 25 y.o. [id number] s/p repeat C-section/BTL at [redacted]w[redacted]d. Today she notes no acute complaints. She denies any problems with ambulating, voiding or po intake. Denies nausea or vomiting. She has minimal flatus, no BM.  Pain manageable- some pain with movement.  Lochia minimal Denies fever/chills/chest pain/SOB.  no HA, no blurry vision, noRUQ pain  Objective: Blood pressure (!) 108/51, pulse 70, temperature 98.4 F (36.9 C), temperature source Oral, resp. rate 16, weight 84.8 kg, last menstrual period 01/04/2021, SpO2 97 %, unknown if currently breastfeeding.  Physical Exam:  General: alert, cooperative and no distress Chest: no respiratory distress Heart: regular rate and rhythm Abdomen: soft, nontender, +BS Uterine Fundus: firm, appropriately tender Incision: C/D/I with honeycomb DVT Evaluation: No calf swelling or tenderness Extremities: no edema Skin: warm, dry  Results for orders placed or performed during the hospital encounter of 10/12/21 (from the past 24 hour(s))  CBC     Status: Abnormal   Collection Time: 10/13/21  5:33 AM  Result Value Ref Range   WBC 13.9 (H) 4.0 - 10.5 K/uL   RBC 3.83 (L) 3.87 - 5.11 MIL/uL   Hemoglobin 11.1 (L) 12.0 - 15.0 g/dL   HCT 31.5 (L) 40.0 - 86.7 %   MCV 89.0 80.0 - 100.0 fL   MCH 29.0 26.0 - 34.0 pg   MCHC 32.6 30.0 - 36.0 g/dL   RDW 61.9 50.9 - 32.6 %   Platelets 157 150 - 400 K/uL   nRBC 0.0 0.0 - 0.2 %    Assessment/Plan: Jamie Hess is a 25 y.o. [phone] s/p repeat C-section/BTL at [redacted]w[redacted]d POD#1  -continue routine postop care -meeting milestones appropriately Contraception: tubal Feeding: breast  Dispo: continue post-op care, plan for discharge home tomorrow   LOS: 1 day   Myna Hidalgo, DO Faculty Attending, [facility] 10/13/2021, 9:32 AM

## 2021-10-13 NOTE — Progress Notes (Signed)
Ipad interpreter # [id number] used for assessment

## 2021-10-14 MED ORDER — OXYCODONE HCL 5 MG PO TABS
5.0000 mg | ORAL_TABLET | Freq: Four times a day (QID) | ORAL | 0 refills | Status: AC | PRN
Start: 2021-10-14 — End: ?

## 2021-10-14 MED ORDER — IBUPROFEN 600 MG PO TABS: 600.0000 mg | ORAL_TABLET | Freq: Four times a day (QID) | ORAL | 0 refills | Status: AC

## 2021-10-14 NOTE — Lactation Note (Signed)
This note was copied from a baby's chart. Lactation Consultation Note  Patient Name: Jamie Hess [id number] Date: 10/14/2021 Reason for consult: Follow-up assessment;Term Age:27 hours   Follow Up Lactation Consult:  In house Spanish interpreter used for interpretation.  Mother's current feeding preference is breast/formula.  Father was formula feeding baby when I arrived.  She had no questions/concerns related to breast feeding.  Mother is breast feeding prior to giving supplementation.  She will continue to latch and supplement.  Discussed milk "coming to volume" and engorgement prevention/treatment.  Mother has a manual pump for home use.  Baby has been feeding/voiding/stooling well.  Informed parents of discharge process.   Maternal Data Has patient been taught Hand Expression?: Yes Does the patient have breastfeeding experience prior to this delivery?: Yes How long did the patient breastfeed?: 3 months with her first two children and 12 months with her last child  Feeding Mother's Current Feeding Choice: Breast Milk and Formula  LATCH Score                    Lactation Tools Discussed/Used Tools: Pump Flange Size: 24 Breast pump type: Manual Reason for Pumping: Breast stimulation  Interventions Interventions: Education  Discharge Discharge Education: Engorgement and breast care Pump: Manual WIC Program: Yes  Consult Status Consult Status: Complete Date: 10/14/21 Follow-up type: Call as needed    Oney Folz R Nickalous Stingley 10/14/2021, 10:23 AM

## 2021-10-16 LAB — SURGICAL PATHOLOGY

## 2021-10-19 ENCOUNTER — Other Ambulatory Visit: Payer: Self-pay

## 2021-10-19 ENCOUNTER — Ambulatory Visit (INDEPENDENT_AMBULATORY_CARE_PROVIDER_SITE_OTHER): Payer: Self-pay | Admitting: General Practice

## 2021-10-19 VITALS — BP 124/76 | HR 79 | Ht 63.0 in | Wt 179.0 lb

## 2021-10-19 DIAGNOSIS — Z5189 Encounter for other specified aftercare: Secondary | ICD-10-CM

## 2021-10-19 DIAGNOSIS — Z5941 Food insecurity: Secondary | ICD-10-CM

## 2021-10-19 NOTE — Progress Notes (Signed)
Patient presents to office today for wound check following repeat c-section on 12/16. Patient reports doing well since then just has some constipation. OTC meds reviewed with patient. Incision is clean, dry & intact and appears to be healing well. Steri strips removed. Reviewed wound care and signs & symptoms of infection. Patient will return to [facility] for pp care.  Chase Caller RN BSN 10/19/21

## 2021-10-19 NOTE — Patient Instructions (Signed)
Docusate sodium (colace)- once or twice a day Miralax as needed

## 2021-10-23 NOTE — Progress Notes (Signed)
Agree with nurses's documentation of this patient's clinic encounter.  Garnette Greb L, MD  

## 2021-10-25 ENCOUNTER — Telehealth (HOSPITAL_COMMUNITY): Payer: Self-pay | Admitting: *Deleted

## 2021-10-25 IMAGING — US US MFM OB FOLLOW-UP
1 series · 13 of 28 positions shown · non-contrast
Comparison: none

[Series 1: us mfm ob follow-up · 13 of 34 slices shown]
[im 2/34]
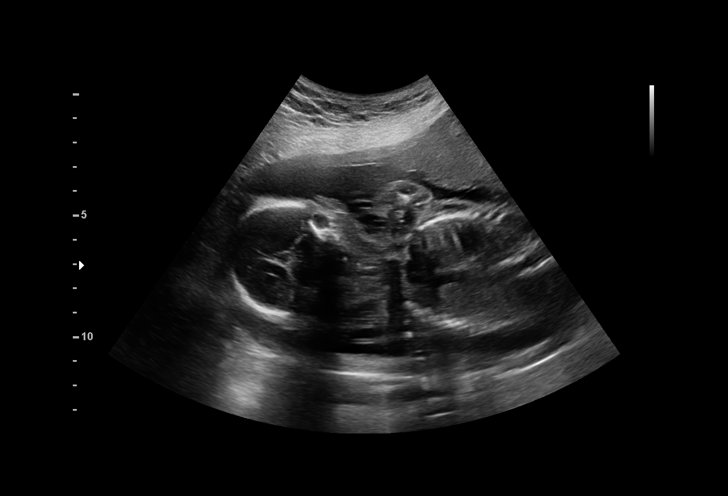
[im 4/34]
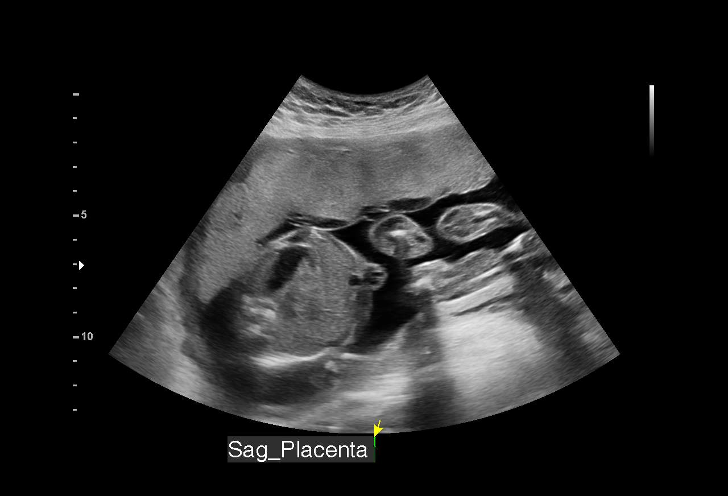
[im 7/34]
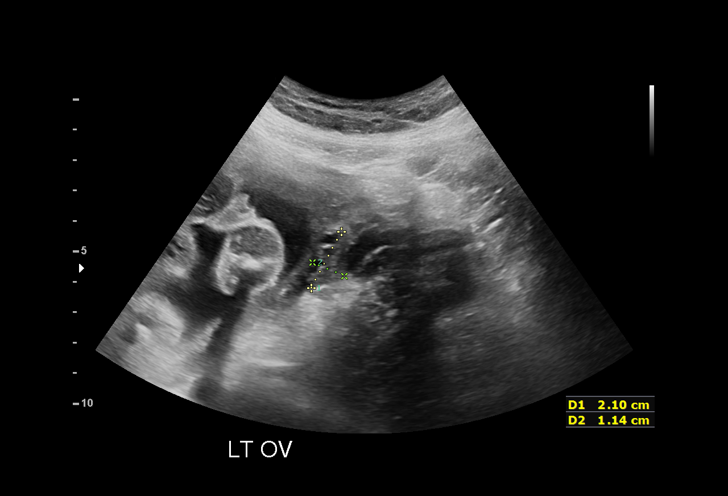
[im 9/34]
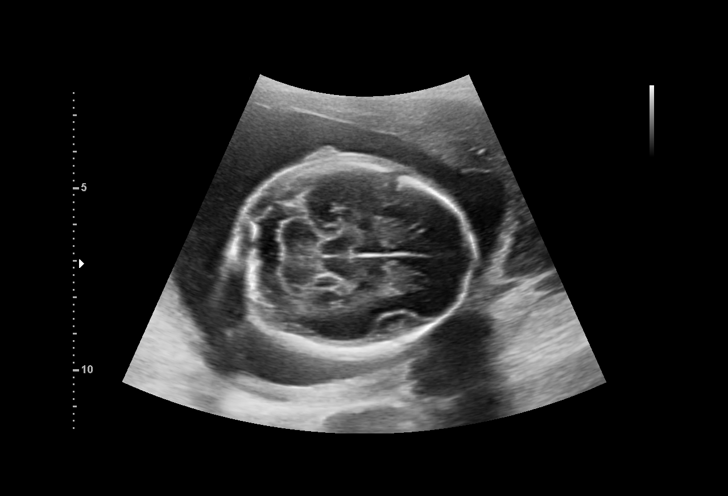
[im 12/34]
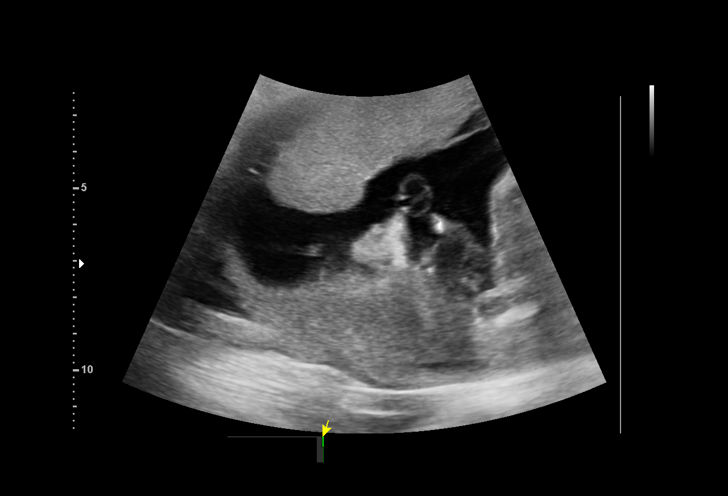
[im 14/34]
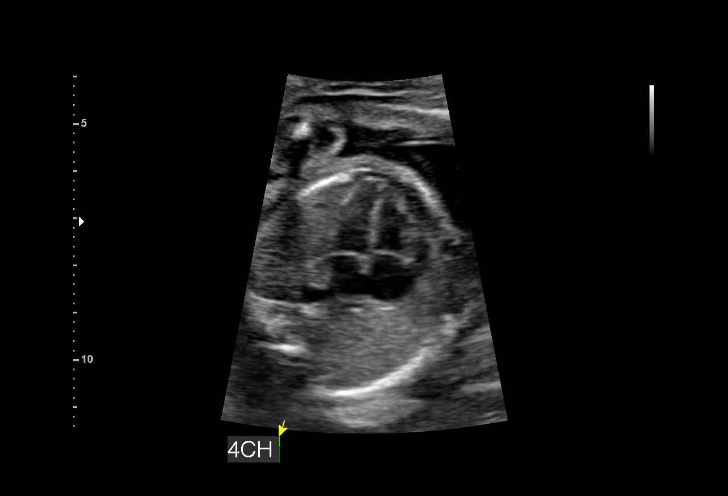
[im 18/34]
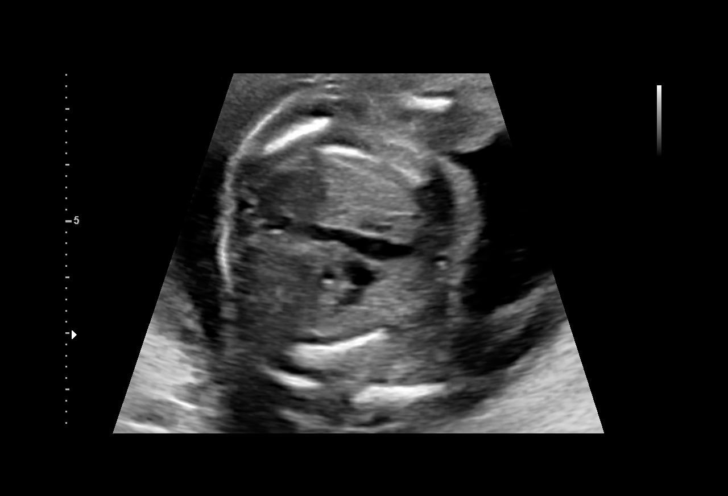
[im 20/34]
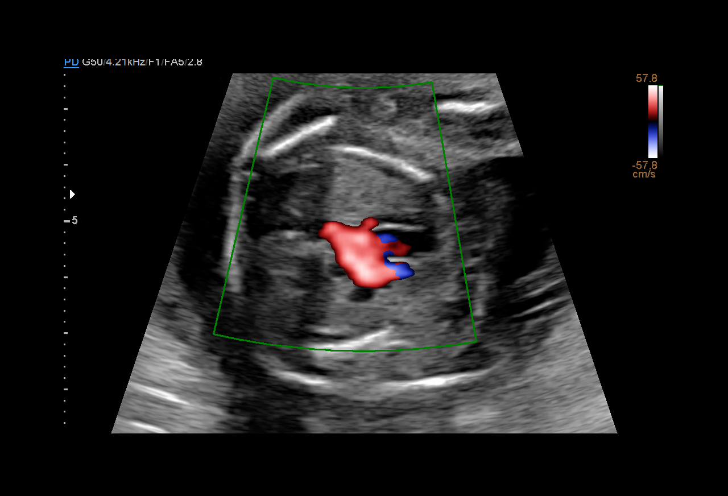
[im 23/34]
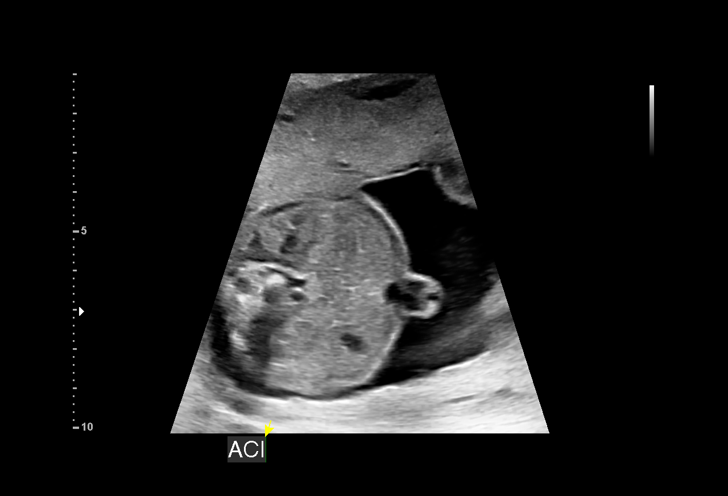
[im 25/34]
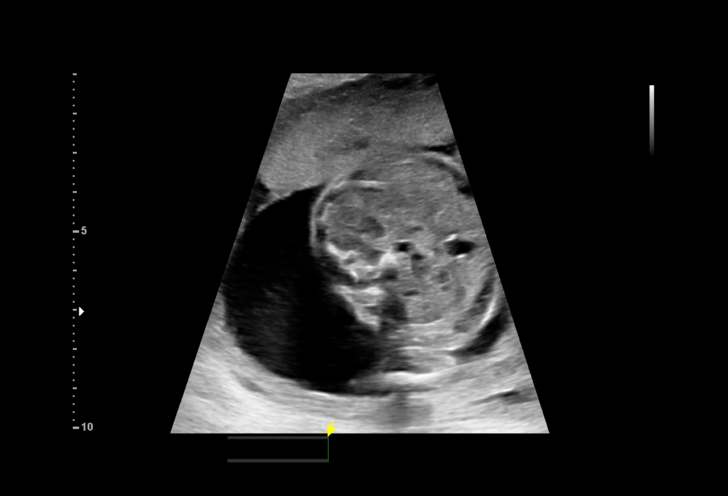
[im 27/34]
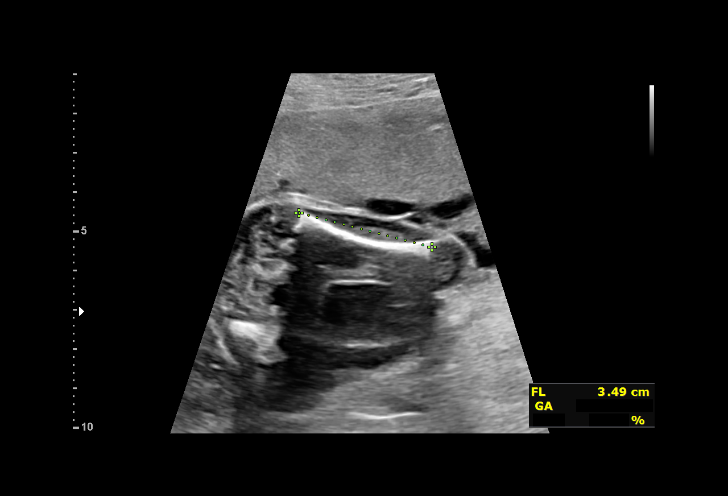
[im 30/34]
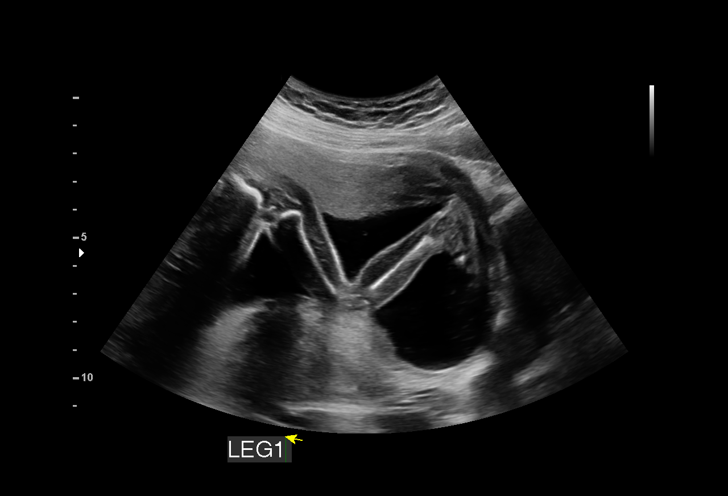
[im 32/34]
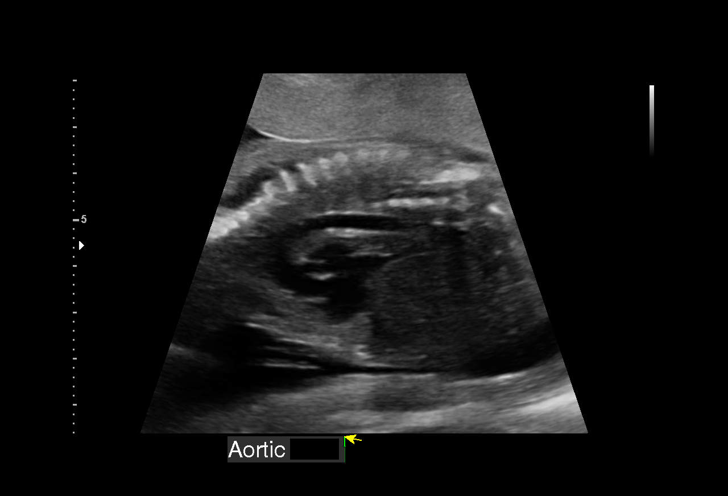

[13 of 28 positions shown; findings below may reference images not displayed]

Indications

 21 weeks gestation of pregnancy
 Fetal abnormality - other known or
 suspected (prior child with Usher syndrome)
 Genetic carrier - usher syndrome type 1F
 MSS T21 risk 1 in 262
Fetal Evaluation

 Num Of Fetuses:         1
 Fetal Heart Rate(bpm):  140
 Cardiac Activity:       Observed
 Presentation:           Breech
 Placenta:               Anterior
 P. Cord Insertion:      Previously Visualized

 Amniotic Fluid
 AFI FV:      Within normal limits

                             Largest Pocket(cm)

Biometry

 BPD:      51.5  mm     G. Age:  21w 4d         38  %    CI:        69.74   %    70 - 86
                                                         FL/HC:      17.7   %    18.4 -
 HC:      196.8  mm     G. Age:  21w 6d         39  %    HC/AC:      1.10        1.06 -
 AC:      178.6  mm     G. Age:  22w 5d         71  %    FL/BPD:     67.8   %    71 - 87
 FL:       34.9  mm     G. Age:  21w 0d         15  %    FL/AC:      19.5   %    20 - 24

 LV:        5.3  mm

 Est. FW:     463  gm           1 lb     48  %
OB History

 Gravidity:    4         Term:   3
 Living:       3
Gestational Age

 LMP:           23w 0d        Date:  01/04/21                 EDD:   10/11/21
 U/S Today:     21w 6d                                        EDD:   10/19/21
 Best:          21w 6d     Det. By:  U/S  (05/17/21)          EDD:   10/19/21
Anatomy

 Cranium:               Appears normal         Aortic Arch:            Appears normal
 Cavum:                 Previously seen        Ductal Arch:            Previously seen
 Ventricles:            Appears normal         Diaphragm:              Previously seen
 Choroid Plexus:        Previously seen        Stomach:                Appears normal, left
                                                                       sided
 Cerebellum:            Previously seen        Abdomen:                Previously seen
 Posterior Fossa:       Previously seen        Abdominal Wall:         Appears nml (cord
                                                                       insert, abd wall)
 Nuchal Fold:           Previously seen        Cord Vessels:           Appears normal (3
                                                                       vessel cord)
 Face:                  Orbits and profile     Kidneys:                Appear normal
                        previously seen
 Lips:                  Appears normal         Bladder:                Appears normal
 Thoracic:              Previously seen        Spine:                  Previously seen
 Heart:                 Appears normal         Upper Extremities:      Previously seen
                        (4CH, axis, and
                        situs)
 RVOT:                  Previously seen        Lower Extremities:      Appears normal
 LVOT:                  Previously seen

 Other:  Heels and 5th digit previously visualized. VC, 3VV and 3VTV
         previously visualized. Female gender previously seen.
Cervix Uterus Adnexa

 Cervix
 Length:           4.58  cm.
 Normal appearance by transabdominal scan.

 Right Ovary
 Visualized.

 Left Ovary
 Visualized.
Impression

 Follow up growth due to complete fetal anatomy with known
 increased risk for T21.
 Normal interval growth with measurements consistent with
 dates
 Good fetal movement and amniotic fluid volume

 Anatomy completed today.

 Prior child with Ginna syndrome. She has genetic
 counseling scheduled today.
 No markers of aneuploidy was observed.
Recommendations

 Follow up growth in 6-8 weeks.

## 2021-10-25 NOTE — Telephone Encounter (Signed)
No voicemail set up, unable to leave message.  Duffy Rhody, RN 10-15-2021 at 9:07am

## 2021-11-23 ENCOUNTER — Ambulatory Visit: Payer: Self-pay | Admitting: Medical

## 2022-04-26 ENCOUNTER — Other Ambulatory Visit (HOSPITAL_BASED_OUTPATIENT_CLINIC_OR_DEPARTMENT_OTHER): Payer: Self-pay

## 2022-06-03 ENCOUNTER — Other Ambulatory Visit (HOSPITAL_COMMUNITY): Payer: Self-pay

## 2023-08-20 ENCOUNTER — Ambulatory Visit (INDEPENDENT_AMBULATORY_CARE_PROVIDER_SITE_OTHER): Payer: Self-pay | Admitting: Podiatry

## 2023-08-20 ENCOUNTER — Encounter: Payer: Self-pay | Admitting: Podiatry

## 2023-08-20 VITALS — Ht 63.0 in | Wt 179.0 lb

## 2023-08-20 DIAGNOSIS — L600 Ingrowing nail: Secondary | ICD-10-CM

## 2023-08-20 NOTE — Patient Instructions (Signed)

## 2023-08-21 NOTE — Progress Notes (Signed)
Subjective:   Patient ID: Jamie Hess, female   DOB: 27 y.o.   MRN: [id number]   HPI Patient presents with caregiver with painful ingrown toenail deformity right big toe medial border that is been sore and she has tried to soak it and trim it.  Patient has interpreter   Review of Systems  All other systems reviewed and are negative.       Objective:  Physical Exam Vitals and nursing note reviewed.  Constitutional:      Appearance: She is well-developed.  Pulmonary:     Effort: Pulmonary effort is normal.  Musculoskeletal:        General: Normal range of motion.  Skin:    General: Skin is warm.  Neurological:     Mental Status: She is alert.     Neurovascular status intact muscle strength adequate range of motion within normal limits with incurvated medial border of the right big toe painful when pressed no active drainage noted     Assessment:  Ingrown toenail deformity right hallux medial border with pain     Plan:  H&P reviewed with patient and translator and recommended correction of deformity and translator reviewed permanent removal of nail border with patient understanding risk.  Today infiltrated the right big toe 60 mg like Marcaine mixture sterile prep done and using sterile instrumentation remove the border exposed matrix applied phenol 3 applications 30 seconds followed by alcohol lavage sterile dressing and gave instructions on soaks in Spanish.  Reappoint as symptoms indicate
